# Patient Record
Sex: Female | Born: 1960 | Race: White | Hispanic: No | Marital: Married | Smoking: Current every day smoker
Health system: Southern US, Community
[De-identification: ages and names within clinical notes are randomized; demographics above are authoritative.]

## PROBLEM LIST (undated history)

## (undated) DIAGNOSIS — I712 Thoracic aortic aneurysm, without rupture, unspecified: Secondary | ICD-10-CM

## (undated) DIAGNOSIS — T4145XA Adverse effect of unspecified anesthetic, initial encounter: Secondary | ICD-10-CM

## (undated) DIAGNOSIS — T8859XA Other complications of anesthesia, initial encounter: Secondary | ICD-10-CM

## (undated) DIAGNOSIS — Z9289 Personal history of other medical treatment: Secondary | ICD-10-CM

## (undated) DIAGNOSIS — E785 Hyperlipidemia, unspecified: Secondary | ICD-10-CM

## (undated) DIAGNOSIS — I10 Essential (primary) hypertension: Secondary | ICD-10-CM

## (undated) DIAGNOSIS — Z860100 Personal history of colon polyps, unspecified: Secondary | ICD-10-CM

## (undated) DIAGNOSIS — N1330 Unspecified hydronephrosis: Secondary | ICD-10-CM

## (undated) DIAGNOSIS — R1900 Intra-abdominal and pelvic swelling, mass and lump, unspecified site: Secondary | ICD-10-CM

## (undated) DIAGNOSIS — K589 Irritable bowel syndrome without diarrhea: Secondary | ICD-10-CM

## (undated) DIAGNOSIS — K5730 Diverticulosis of large intestine without perforation or abscess without bleeding: Secondary | ICD-10-CM

## (undated) DIAGNOSIS — I714 Abdominal aortic aneurysm, without rupture, unspecified: Secondary | ICD-10-CM

## (undated) DIAGNOSIS — R51 Headache: Secondary | ICD-10-CM

## (undated) HISTORY — DX: Abdominal aortic aneurysm, without rupture: I71.4

## (undated) HISTORY — DX: Essential (primary) hypertension: I10

## (undated) HISTORY — DX: Personal history of colonic polyps: Z86.010

## (undated) HISTORY — DX: Hyperlipidemia, unspecified: E78.5

## (undated) HISTORY — PX: OTHER SURGICAL HISTORY: SHX169

## (undated) HISTORY — DX: Abdominal aortic aneurysm, without rupture, unspecified: I71.40

## (undated) HISTORY — DX: Thoracic aortic aneurysm, without rupture, unspecified: I71.20

## (undated) HISTORY — PX: LEG SURGERY: SHX1003

## (undated) HISTORY — DX: Irritable bowel syndrome, unspecified: K58.9

## (undated) HISTORY — DX: Unspecified hydronephrosis: N13.30

## (undated) HISTORY — DX: Thoracic aortic aneurysm, without rupture: I71.2

## (undated) HISTORY — DX: Personal history of colon polyps, unspecified: Z86.0100

---

## 2006-09-17 ENCOUNTER — Ambulatory Visit (HOSPITAL_COMMUNITY): Admission: RE | Admit: 2006-09-17 | Discharge: 2006-09-17 | Payer: Self-pay | Admitting: Surgery

## 2006-11-06 ENCOUNTER — Ambulatory Visit (HOSPITAL_COMMUNITY): Admission: RE | Admit: 2006-11-06 | Discharge: 2006-11-06 | Payer: Self-pay | Admitting: *Deleted

## 2007-03-03 ENCOUNTER — Emergency Department (HOSPITAL_COMMUNITY): Admission: EM | Admit: 2007-03-03 | Discharge: 2007-03-03 | Payer: Self-pay | Admitting: Emergency Medicine

## 2007-08-26 ENCOUNTER — Encounter: Admission: RE | Admit: 2007-08-26 | Discharge: 2007-08-26 | Payer: Self-pay | Admitting: Surgery

## 2007-08-26 ENCOUNTER — Ambulatory Visit: Payer: Self-pay | Admitting: Surgery

## 2007-09-11 ENCOUNTER — Ambulatory Visit: Payer: Self-pay | Admitting: *Deleted

## 2007-10-02 ENCOUNTER — Ambulatory Visit: Payer: Self-pay | Admitting: Vascular Surgery

## 2007-10-03 ENCOUNTER — Ambulatory Visit: Payer: Self-pay | Admitting: *Deleted

## 2007-10-03 ENCOUNTER — Inpatient Hospital Stay (HOSPITAL_COMMUNITY): Admission: RE | Admit: 2007-10-03 | Discharge: 2007-10-06 | Payer: Self-pay | Admitting: *Deleted

## 2007-10-30 ENCOUNTER — Encounter: Admission: RE | Admit: 2007-10-30 | Discharge: 2007-10-30 | Payer: Self-pay | Admitting: *Deleted

## 2007-10-30 ENCOUNTER — Ambulatory Visit: Payer: Self-pay | Admitting: *Deleted

## 2008-01-07 IMAGING — CR DG CHEST 2V
1 series · 1 of 1 positions shown · non-contrast
Comparison: 10/03/07.

CLINICAL DATA: Status post surgery for thoracic aneurysm.
 CHEST ? 2 VIEW:

[w chest lat]
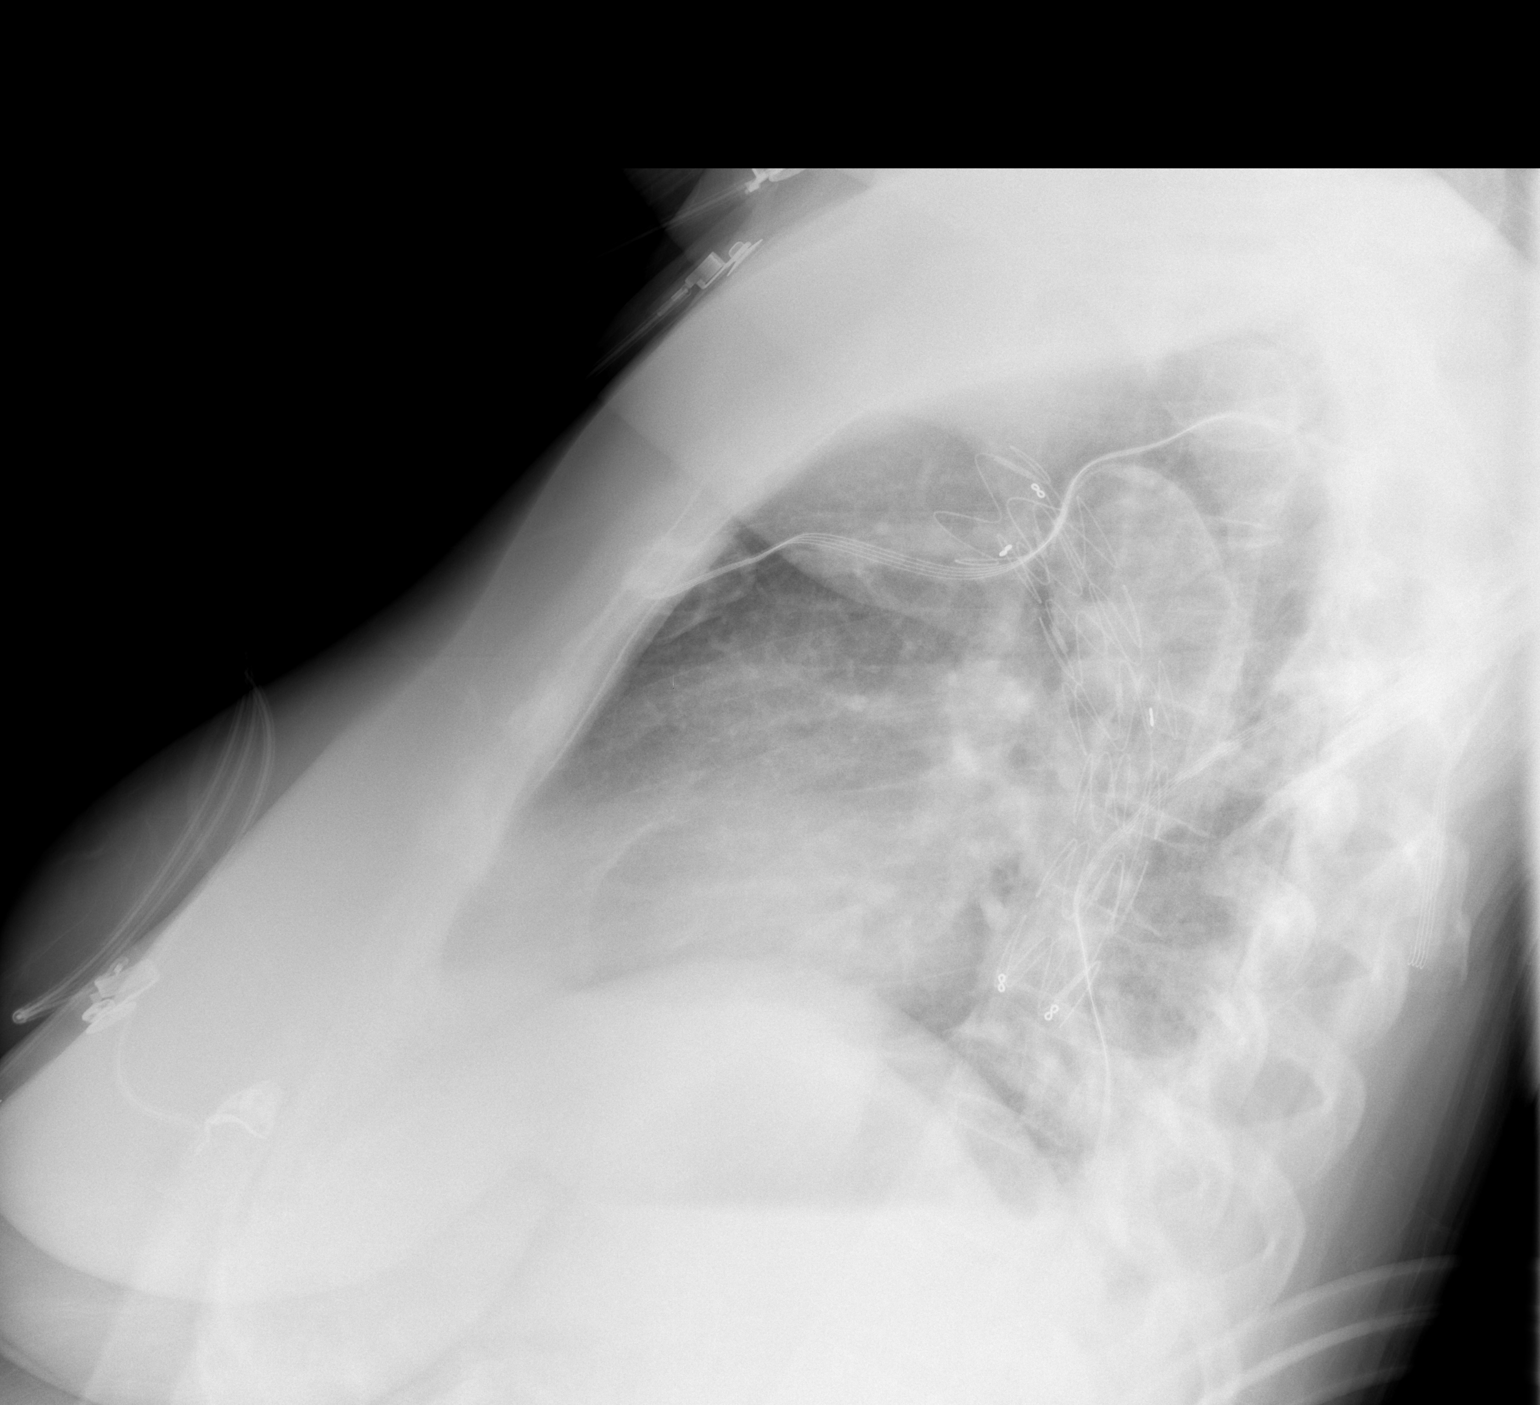

[1 of 1 positions shown; findings below may reference images not displayed]

FINDINGS: Swan-Ganz catheter has been removed.  There is new mild discoid atelectasis in the left base.  The lungs are otherwise clear.  No pneumothorax.  Heart size is normal.
IMPRESSION: Mild discoid atelectasis, left lung base.  Otherwise, negative.

## 2008-02-12 ENCOUNTER — Ambulatory Visit: Payer: Self-pay | Admitting: *Deleted

## 2008-02-12 ENCOUNTER — Encounter: Admission: RE | Admit: 2008-02-12 | Discharge: 2008-02-12 | Payer: Self-pay | Admitting: *Deleted

## 2008-02-19 ENCOUNTER — Encounter: Admission: RE | Admit: 2008-02-19 | Discharge: 2008-02-19 | Payer: Self-pay | Admitting: *Deleted

## 2008-02-19 ENCOUNTER — Ambulatory Visit: Payer: Self-pay | Admitting: *Deleted

## 2008-08-10 ENCOUNTER — Ambulatory Visit (HOSPITAL_COMMUNITY): Admission: RE | Admit: 2008-08-10 | Discharge: 2008-08-10 | Payer: Self-pay | Admitting: *Deleted

## 2008-08-12 ENCOUNTER — Ambulatory Visit (HOSPITAL_COMMUNITY): Admission: RE | Admit: 2008-08-12 | Discharge: 2008-08-12 | Payer: Self-pay | Admitting: *Deleted

## 2008-08-26 ENCOUNTER — Ambulatory Visit: Payer: Self-pay | Admitting: *Deleted

## 2009-03-03 ENCOUNTER — Encounter: Admission: RE | Admit: 2009-03-03 | Discharge: 2009-03-03 | Payer: Self-pay | Admitting: *Deleted

## 2009-03-03 ENCOUNTER — Ambulatory Visit: Payer: Self-pay | Admitting: *Deleted

## 2011-01-14 ENCOUNTER — Encounter: Payer: Self-pay | Admitting: Vascular Surgery

## 2011-01-14 ENCOUNTER — Encounter: Payer: Self-pay | Admitting: *Deleted
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
## 2011-05-08 NOTE — Assessment & Plan Note (Signed)
OFFICE VISIT   DELORSE, SHANE  DOB:  [birth year]                                       03/03/2009  CHART#:[id number]   Liberty Seto is a 50 year old female who underwent placement of  thoracic aortic stent graft 10/03/2007 at [facility].  This was  placed via a right retroperitoneal iliac conduit.  She did have some  postoperative right hydronephrosis, however this has resolved quite  nicely on her most recent ultrasound.   CT scan was performed today, this reveals no complicating features  associated with stent graft.  This is well placed without evidence of  flow into the aneurysm sac.   Ms. Rumer appears well.  BP 120/75, pulse 112 per minute.  Chest is  clear without rales or rhonchi.  Normal heart sounds without murmurs.  No carotid bruits.   Overall, Ms. Roemmich is doing well following placement of thoracic  Endograft.  We will plan follow-up in 1 year with repeat CT scan.   Balinda Quails, M.D.  Electronically Signed   PGH/MEDQ  D:  03/03/2009  T:  03/04/2009  Job:  [id number]   cc:   Brent Bulla, MD
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
## 2011-05-08 NOTE — Assessment & Plan Note (Signed)
OFFICE VISIT   Karina, Lewis  DOB:  [birth year]                                       10/30/2007  CHART#:[id number]   Patient is status post placement of a proximal descending thoracic  aortic stent graft (Talent 22 x 22 x 11.6) on October 03, 2007 at [facility].  This was an uneventful procedure.  She presents today  with a CT scan.  This reveals good position of the stent graft.  This  reveals good position of the stent graft without evidence of endoleak.   Patient did undergo a right retroperitoneal approach for placement of a  conduit into the terminal aorta.  This is healing well.  No evidence of  herniation.  Abdomen is soft and nontender.  BP 142/88, pulse 82 per  minute, respirations 16 per minute.   Patient is recovering well following her surgery.  Will plan to follow  up with her again in three months with a repeat CT scan.   Balinda Quails, M.D.  Electronically Signed   PGH/MEDQ  D:  10/30/2007  T:  10/31/2007  Job:  [id number]   cc:   Evelene Croon, M.D.  Peter M. Swaziland, M.D.  Tanvir A. Chodri, M.D.
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
## 2011-05-08 NOTE — Assessment & Plan Note (Signed)
OFFICE VISIT   RANDILYN, FOISY  DOB:  09/07/1961                                       08/26/2008  CHART#:[id number]   The patient had a thoracic aortic stent graft placed 10/03/2007 at [facility].  This was placed via conduit from the abdominal aorta via  a right retroperitoneal incision.   Postoperatively she was noted to have mild to moderate hydronephrosis.  She presents at this time with followup studies.  Blood renal functions  study shows no functional obstructive hydronephrosis of the right  kidney.  Normal differential renal scan bilaterally.  Ultrasound reveals  mild fullness in the right collecting system which has improved since  prior ultrasound.   CT scan of the chest reveals no complicating features associated with  thoracic aortic stent graft and mild contraction of the excluded  aneurysm sac.   The patient is doing well.  Her blood pressure is 120/77, pulse 84 per  minute.  Her chest is clear with equal entry bilaterally.  Abdomen is  soft and nontender.  No masses or organomegaly.  A well-healed right  retroperitoneal incision.   Overall, the patient is doing well following her surgery.  I will plan  to follow up with her again in 6 months with repeat CT scan.   Balinda Quails, M.D.  Electronically Signed   PGH/MEDQ  D:  08/26/2008  T:  08/28/2008  Job:  [id number]
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
## 2011-05-08 NOTE — Assessment & Plan Note (Signed)
OFFICE VISIT   [name], [name]  DOB:  02-Oct-1961                                       02/19/2008  CHART#:[id number]   HISTORY:  The patient returned to the office today after undergoing  abdominal ultrasound.  This does reveal mild to moderate right  hydronephrosis.  I did discuss the results of this ultrasound with Dr.  Lynelle Smoke I. Tannenbaum.  He suggested followup in 6 months with repeat  ultrasound and a Lasix renogram.  This will be arranged at the time of  her followup for monitoring of her stent graft.   The patient was informed of these results.  Will plan therefore followup  in 6 months as noted.   Balinda Quails, M.D.  Electronically Signed   PGH/MEDQ  D:  02/19/2008  T:  02/21/2008  Job:  [id number]

## 2011-05-08 NOTE — Assessment & Plan Note (Signed)
OFFICE VISIT   [name], [name]  DOB:  Nov 28, 1961                                       02/12/2008  CHART#:[id number]   Patient is status post placement of a proximal descending thoracic  aortic stent graft in October of last year.  She had a follow-up CT scan  at this time.  This reveals the stent to be in good position with  thrombosis of the proximal thoracic aneurysm.   A note is made on the CAT scan of possible hydronephrosis in the right  kidney.  She did undergo a right retroperitoneal approach for placement  of conduit into the terminal aorta.   PHYSICAL EXAMINATION:  Patient has no complaints at this time.  BP  149/81, pulse 120 per minute, respirations 18 per minute.  Incision is  well healed in the abdomen.  No tenderness.  Femoral pulses are 2+  bilaterally.   I am going to go ahead and order an ultrasound of the abdomen to further  evaluate this right kidney.  If there indeed is significant  hydronephrosis, plan referral to urologist for further evaluation.   Balinda Quails, M.D.  Electronically Signed   PGH/MEDQ  D:  02/12/2008  T:  02/13/2008  Job:  [id number]   cc:   Janine Limbo, MD
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
## 2011-10-04 LAB — URINALYSIS, ROUTINE W REFLEX MICROSCOPIC
Glucose, UA: NEGATIVE
Hgb urine dipstick: NEGATIVE
Protein, ur: NEGATIVE
Specific Gravity, Urine: 1.018
Urobilinogen, UA: 0.2

## 2011-10-04 LAB — COMPREHENSIVE METABOLIC PANEL
ALT: 22
AST: 21
CO2: 22
Calcium: 9.3
GFR calc Af Amer: >60
GFR calc non Af Amer: >60
Sodium: 139
Total Protein: 7.4

## 2011-10-04 LAB — POCT I-STAT 7, (LYTES, BLD GAS, ICA,H+H)
Acid-base deficit: 2
Bicarbonate: 25.2 — ABNORMAL HIGH
Calcium, Ion: 1.17
HCT: 35 — ABNORMAL LOW
Hemoglobin: 11.9 — ABNORMAL LOW
O2 Saturation: 100
Operator id: 249321
Patient temperature: 35.8
Sodium: 143
TCO2: 26
pCO2 arterial: 36.7
pCO2 arterial: 38.6
pH, Arterial: 7.4
pO2, Arterial: 294 — ABNORMAL HIGH
pO2, Arterial: 417 — ABNORMAL HIGH

## 2011-10-04 LAB — CBC
MCHC: 34.2
MCHC: 34.5
MCV: 94
Platelets: 151
RBC: 3.3 — ABNORMAL LOW
RBC: 5.1
WBC: 12.1 — ABNORMAL HIGH
WBC: 12.3 — ABNORMAL HIGH

## 2011-10-04 LAB — TYPE AND SCREEN
ABO/RH(D): O POS
DAT, IgG: NEGATIVE

## 2011-10-04 LAB — BASIC METABOLIC PANEL
BUN: 7
Calcium: 8.1 — ABNORMAL LOW
Calcium: 8.1 — ABNORMAL LOW
Chloride: 105
Chloride: 107
Creatinine, Ser: 0.64
Creatinine, Ser: 0.69
GFR calc Af Amer: >60

## 2011-10-04 LAB — BLOOD GAS, ARTERIAL
Drawn by: 206361
FIO2: 0.21
O2 Saturation: 96.8
pCO2 arterial: 38.3
pH, Arterial: 7.409 — ABNORMAL HIGH
pO2, Arterial: 84.6

## 2011-10-04 LAB — URINE MICROSCOPIC-ADD ON

## 2013-11-30 ENCOUNTER — Encounter: Payer: Self-pay | Admitting: Vascular Surgery

## 2013-12-01 ENCOUNTER — Ambulatory Visit: Payer: Commercial Managed Care - PPO | Admitting: Lab

## 2013-12-01 ENCOUNTER — Ambulatory Visit: Payer: Commercial Managed Care - PPO | Attending: Gynecologic Oncology | Admitting: Gynecologic Oncology

## 2013-12-01 ENCOUNTER — Encounter: Payer: Self-pay | Admitting: Gynecologic Oncology

## 2013-12-01 VITALS — BP 143/68 | HR 85 | Temp 98.6°F | Resp 16 | Ht 63.78 in | Wt 185.8 lb

## 2013-12-01 DIAGNOSIS — I714 Abdominal aortic aneurysm, without rupture, unspecified: Secondary | ICD-10-CM

## 2013-12-01 DIAGNOSIS — I10 Essential (primary) hypertension: Secondary | ICD-10-CM

## 2013-12-01 DIAGNOSIS — E785 Hyperlipidemia, unspecified: Secondary | ICD-10-CM | POA: Insufficient documentation

## 2013-12-01 DIAGNOSIS — Z79899 Other long term (current) drug therapy: Secondary | ICD-10-CM | POA: Insufficient documentation

## 2013-12-01 DIAGNOSIS — R1900 Intra-abdominal and pelvic swelling, mass and lump, unspecified site: Secondary | ICD-10-CM

## 2013-12-01 DIAGNOSIS — N9489 Other specified conditions associated with female genital organs and menstrual cycle: Secondary | ICD-10-CM | POA: Insufficient documentation

## 2013-12-01 DIAGNOSIS — R142 Eructation: Secondary | ICD-10-CM | POA: Insufficient documentation

## 2013-12-01 DIAGNOSIS — N1330 Unspecified hydronephrosis: Secondary | ICD-10-CM

## 2013-12-01 DIAGNOSIS — R141 Gas pain: Secondary | ICD-10-CM | POA: Insufficient documentation
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
## 2013-12-01 NOTE — Patient Instructions (Signed)
We'll call you with the results of your bloodwork from today. Please call Dr. Duard Brady at [phone] after your appointment with Dr. Arbie Cookey.

## 2013-12-02 LAB — CA 125: CA 125: 10 U/mL (ref 0.0–30.2)

## 2013-12-03 ENCOUNTER — Encounter: Payer: Self-pay | Admitting: Vascular Surgery

## 2013-12-04 ENCOUNTER — Telehealth: Payer: Self-pay | Admitting: Gynecologic Oncology

## 2013-12-04 NOTE — Telephone Encounter (Signed)
Patient informed of CA 125 results.  No concerns voiced.  Instructed to call for any needs or concerns. 

## 2013-12-08 ENCOUNTER — Ambulatory Visit: Payer: Self-pay | Admitting: Gynecologic Oncology

## 2013-12-14 ENCOUNTER — Encounter: Payer: Self-pay | Admitting: Vascular Surgery

## 2013-12-15 ENCOUNTER — Encounter: Payer: Self-pay | Admitting: Vascular Surgery

## 2013-12-15 ENCOUNTER — Ambulatory Visit (INDEPENDENT_AMBULATORY_CARE_PROVIDER_SITE_OTHER): Payer: Commercial Managed Care - PPO | Admitting: Vascular Surgery

## 2013-12-15 ENCOUNTER — Encounter: Payer: Self-pay | Admitting: *Deleted

## 2013-12-15 VITALS — BP 123/63 | HR 75 | Ht 63.0 in | Wt 188.2 lb

## 2013-12-15 DIAGNOSIS — I729 Aneurysm of unspecified site: Secondary | ICD-10-CM | POA: Insufficient documentation
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
## 2013-12-16 ENCOUNTER — Encounter: Payer: Self-pay | Admitting: Urology

## 2014-01-06 ENCOUNTER — Other Ambulatory Visit (HOSPITAL_COMMUNITY): Payer: Self-pay | Admitting: Urology

## 2014-01-06 DIAGNOSIS — N1330 Unspecified hydronephrosis: Secondary | ICD-10-CM

## 2014-01-08 ENCOUNTER — Other Ambulatory Visit (HOSPITAL_COMMUNITY): Payer: Self-pay | Admitting: Urology

## 2014-01-08 DIAGNOSIS — N1330 Unspecified hydronephrosis: Secondary | ICD-10-CM

## 2014-01-11 ENCOUNTER — Other Ambulatory Visit: Payer: Self-pay | Admitting: Urology

## 2014-01-13 ENCOUNTER — Encounter (HOSPITAL_COMMUNITY): Payer: Self-pay | Admitting: Pharmacy Technician

## 2014-01-14 ENCOUNTER — Ambulatory Visit (HOSPITAL_COMMUNITY)
Admission: RE | Admit: 2014-01-14 | Discharge: 2014-01-14 | Disposition: A | Discharge status: Home / Self Care | Payer: Commercial Managed Care - PPO | Source: Ambulatory Visit | Attending: Urology | Admitting: Urology

## 2014-01-14 DIAGNOSIS — N1330 Unspecified hydronephrosis: Secondary | ICD-10-CM | POA: Insufficient documentation

## 2014-01-14 MED ORDER — FUROSEMIDE 10 MG/ML IJ SOLN
INTRAMUSCULAR | Status: AC
  Administered 2014-01-14: 42 mg via INTRAVENOUS
  Filled 2014-01-14: qty 2

## 2014-01-14 MED ORDER — FUROSEMIDE 10 MG/ML IJ SOLN
42.0000 mg | Freq: Once | INTRAMUSCULAR | Status: AC
  Administered 2014-01-14: 42 mg via INTRAVENOUS

## 2014-01-14 MED ORDER — TECHNETIUM TC 99M MERTIATIDE
15.0000 | Freq: Once | INTRAVENOUS | Status: AC | PRN
  Administered 2014-01-14: 15 via INTRAVENOUS

## 2014-01-14 MED ORDER — FUROSEMIDE 10 MG/ML IJ SOLN
INTRAMUSCULAR | Status: AC
  Filled 2014-01-14: qty 4

## 2014-01-14 NOTE — Progress Notes (Signed)
Patient has intolerance to Sulfa drugs, was listed as nausea vomiting. Patient not sure re reaction as was a long time ago. Has had Lasix before with no problems. Talked with Pilar Plate in main pharmacy and he said there should be no problem with the Lasix.

## 2014-01-15 ENCOUNTER — Encounter (HOSPITAL_COMMUNITY)
Admission: RE | Admit: 2014-01-15 | Discharge: 2014-01-15 | Disposition: A | Discharge status: Home / Self Care | Payer: Commercial Managed Care - PPO | Source: Ambulatory Visit | Attending: Urology | Admitting: Urology

## 2014-01-15 ENCOUNTER — Ambulatory Visit (HOSPITAL_COMMUNITY)
Admission: RE | Admit: 2014-01-15 | Discharge: 2014-01-15 | Disposition: A | Discharge status: Home / Self Care | Payer: Commercial Managed Care - PPO | Source: Ambulatory Visit | Attending: Urology | Admitting: Urology

## 2014-01-15 ENCOUNTER — Encounter (HOSPITAL_COMMUNITY): Payer: Self-pay

## 2014-01-15 DIAGNOSIS — N1330 Unspecified hydronephrosis: Secondary | ICD-10-CM | POA: Insufficient documentation

## 2014-01-15 DIAGNOSIS — Z01810 Encounter for preprocedural cardiovascular examination: Secondary | ICD-10-CM | POA: Insufficient documentation

## 2014-01-15 DIAGNOSIS — Z01812 Encounter for preprocedural laboratory examination: Secondary | ICD-10-CM | POA: Insufficient documentation

## 2014-01-15 DIAGNOSIS — I719 Aortic aneurysm of unspecified site, without rupture: Secondary | ICD-10-CM | POA: Insufficient documentation

## 2014-01-15 DIAGNOSIS — Z01818 Encounter for other preprocedural examination: Secondary | ICD-10-CM | POA: Insufficient documentation

## 2014-01-15 HISTORY — DX: Adverse effect of unspecified anesthetic, initial encounter: T41.45XA

## 2014-01-15 HISTORY — DX: Intra-abdominal and pelvic swelling, mass and lump, unspecified site: R19.00

## 2014-01-15 HISTORY — DX: Diverticulosis of large intestine without perforation or abscess without bleeding: K57.30

## 2014-01-15 HISTORY — DX: Headache: R51

## 2014-01-15 HISTORY — DX: Other complications of anesthesia, initial encounter: T88.59XA

## 2014-01-15 HISTORY — DX: Unspecified hydronephrosis: N13.30

## 2014-01-15 LAB — BASIC METABOLIC PANEL
BUN: 26 mg/dL — AB (ref 6–23)
CHLORIDE: 102 meq/L (ref 96–112)
CO2: 28 mEq/L (ref 19–32)
Calcium: 9.6 mg/dL (ref 8.4–10.5)
Creatinine, Ser: 1 mg/dL (ref 0.50–1.10)
GFR calc Af Amer: 73 mL/min — ABNORMAL LOW (ref >90)
GFR calc non Af Amer: 63 mL/min — ABNORMAL LOW (ref >90)
Glucose, Bld: 95 mg/dL (ref 70–99)
POTASSIUM: 3.9 meq/L (ref 3.7–5.3)
Sodium: 144 mEq/L (ref 137–147)

## 2014-01-15 LAB — CBC
HCT: 48.8 % — ABNORMAL HIGH (ref 36.0–46.0)
Hemoglobin: 16.4 g/dL — ABNORMAL HIGH (ref 12.0–15.0)
MCH: 30.7 pg (ref 26.0–34.0)
MCHC: 33.6 g/dL (ref 30.0–36.0)
MCV: 91.4 fL (ref 78.0–100.0)
PLATELETS: 213 10*3/uL (ref 150–400)
RBC: 5.34 MIL/uL — AB (ref 3.87–5.11)
RDW: 13.6 % (ref 11.5–15.5)
WBC: 9.2 10*3/uL (ref 4.0–10.5)

## 2014-01-15 NOTE — Pre-Procedure Instructions (Addendum)
EKG AND CXR WERE DONE TODAY - PREOP - AT [facility]. OFFICE NOTES FROM PT'S MEDICAL DOCTOR'S OFFFICE - FIVE POINTS MEDICAL ON CHART - OV WAS 11/03/13. PT STATES SHE HAD ECHO WITH DR. Basilio Cairo  10/09/13 - [facility] IN [place] - PHONE  [phone] --OFFICE CLOSED THIS AFTERNOON - I FAXED A REQUEST FOR THE REPORT AND ANY OFFICE NOTES AND / OR EKG REPORT TO BE FAXED Monday AM TO SHORT STAY CENTER. PT'S BMET REPORT FAXED TO DR. Lynne Logan OFFICE FOR REVIEW OF BUN 26.

## 2014-01-15 NOTE — Patient Instructions (Addendum)
YOUR SURGERY IS SCHEDULED AT [facility]  ON:  Monday  1/26  REPORT TO  SHORT STAY CENTER AT:  12:45 PM      PHONE # FOR SHORT STAY IS [phone]  DO NOT EAT  ANYTHING AFTER MIDNIGHT THE NIGHT BEFORE YOUR SURGERY.  YOU MAY BRUSH YOUR TEETH, RINSE OUT YOUR MOUTH--BUTNO FOOD, NO CHEWING GUM, NO MINTS, NO CANDIES, NO CHEWING TOBACCO. YOU MAY HAVE CLEAR LIQUIDS TO DRINK FROM MIDNIGHT UNTIL 8:30 AM DAY OF SURGERY - LIKE WATER, COFFEE - NO MILK OR MILK PRODUCTS.   NOTHING TO DRINK AFTER 8:30 AM DAY OF SURGERY.  PLEASE TAKE THE FOLLOWING MEDICATIONS THE AM OF YOUR SURGERY WITH A FEW SIPS OF WATER:  METOPROLOL   DO NOT BRING VALUABLES, MONEY, CREDIT CARDS.  DO NOT WEAR JEWELRY, MAKE-UP, NAIL POLISH AND NO METAL PINS OR CLIPS IN YOUR HAIR. CONTACT LENS, DENTURES / PARTIALS, GLASSES SHOULD NOT BE WORN TO SURGERY AND IN MOST CASES-HEARING AIDS WILL NEED TO BE REMOVED.  BRING YOUR GLASSES CASE, ANY EQUIPMENT NEEDED FOR YOUR CONTACT LENS. FOR PATIENTS ADMITTED TO THE HOSPITAL--CHECK OUT TIME THE DAY OF DISCHARGE IS 11:00 AM.  ALL INPATIENT ROOMS ARE PRIVATE - WITH BATHROOM, TELEPHONE, TELEVISION AND WIFI INTERNET.  IF YOU ARE BEING DISCHARGED THE SAME DAY OF YOUR SURGERY--YOU CAN NOT DRIVE YOURSELF HOME--AND SHOULD NOT GO HOME ALONE BY TAXI OR BUS.  NO DRIVING OR OPERATING MACHINERY, DO NOT MAKE LEGAL DECISIONS FOR 24 HOURS FOLLOWING ANESTHESIA / PAIN MEDICATIONS.  PLEASE MAKE ARRANGEMENTS FOR SOMEONE TO BE WITH YOU AT HOME THE FIRST 24 HOURS AFTER SURGERY. RESPONSIBLE DRIVER'S NAME / PHONE                                     [place] IN THE CANCELLATION OF YOUR SURGERY. PLEASE BE AWARE THAT YOU MAY NEED ADDITIONAL BLOOD DRAWN DAY OF YOUR SURGERY  PATIENT SIGNATURE_________________________________

## 2014-01-15 NOTE — H&P (Signed)
Chief Complaint Right hydronephrosis   History of Present Illness [name] is a pleasant 53 year old female seen today in consultation at the request of Dr. Sherren Mocha Early. Overall, she has been relatively healthy although was incidentally found to have a thoracic aortic aneurysm in 2008 and underwent a stent graft repair of her descending thoracic aneurysm on 10/03/07. She apparently had transient right-sided hydronephrosis in 2009 although this had apparently resolved on ultrasound during a visit with her vascular surgeon, Dr. Amedeo Plenty, in March 2010. She has denied any right-sided flank pain or hematuria. She denies a history of renal dysfunction. She has no history of urolithiasis, congenital UPJ obstruction, or history of GU malignancy/trauma/surgery.    At the end of October, she followed up with her cardiologist for a routine surveillance visit. As part of her evaluation, she did undergo a CT scan of the chest for follow-up of her thoracic aortic aneurysm. Incidentally, this detected significant right-sided hydronephrosis on the CT cuts of her upper right kidney. She was then referred to Dr. Nila Nephew for further urologic evaluation. A CT scan of the abdomen and pelvis was performed in November 2014 which confirmed severe incidentally detected right-sided hydronephrosis with evidence of a proximal dilated ureter down to the proximal/mid ureter where a distinct transition point was noted. This was located at a site possibly felt to be related to a saccular aortic aneurysm. In addition, she was noted to have thinning of her renal cortex on the right although did have some demonstrable preserved renal parenchyma. On further review of her records, Dr. Donnetta Hutching noted that she had a Hemashield graft conduit sewn onto the right lateral aspect of the aorta just above the aortic bifurcation. This explained the CT findings and confirmed that this was not a saccular aortic aneurysm but rather radiologic findings  consistent with her graft conduit. 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 Nancy Marus. This cystic mass was present on her imaging study apparently in 2008 and had significantly increased in size. Dr. Alycia Rossetti has recommended proceeding with surgical removal of her ovarian mass.   Past Medical History Problems  1. History of abdominal aortic aneurysm (V12.59) 2. History of colonic polyps (V12.72) 3. History of hyperlipidemia (V12.29) 4. History of hypertension (V12.59) 5. History of irritable bowel syndrome (V12.79) 6. History of Thoracic aortic aneurysm (441.2)  Surgical History Problems  1. History of Femur Repair 2. History of Leg Repair 3. History of Surgery For Abdominal Aortic Aneurysm  Current Meds 1. Cinnamon CAPS;  Therapy: (Recorded:13Jan2015) to Recorded 2. CoQ10 100 MG Oral Capsule;  Therapy: (Recorded:13Jan2015) to Recorded 3. Magnesium CAPS;  Therapy: (Recorded:13Jan2015) to Recorded 4. Multi-Vitamin TABS;  Therapy: (Recorded:13Jan2015) to Recorded 5. Naproxen Sodium 550 MG Oral Tablet;  Therapy: (Recorded:13Jan2015) to Recorded 6. Pravastatin Sodium 40 MG Oral Tablet;  Therapy: (Recorded:13Jan2015) to Recorded 7. Sominex TABS;  Therapy: (Recorded:13Jan2015) to Recorded 8. Toprol XL 25 MG Oral Tablet Extended Release 24 Hour (Metoprolol Succinate ER);  Therapy: (Recorded:13Jan2015) to Recorded 9. Vitamin C 500 MG Oral Capsule;  Therapy: (Recorded:13Jan2015) to Recorded  Allergies Medication  1. Percodan TABS 2. Sulfa Drugs Non-Medication  3. Adhesive Tape  Family History Problems  1. Family history of cardiac disorder (V17.49) : Mother 2. Family history of diabetes mellitus (V18.0) : Mother 3. Family history of hyperlipidemia (V18.19) : Mother, Father 4. Family history of hypertension (V17.49) : Father  Social History Problems    Alcohol use  1 glass of wine per week    Current every day smoker (305.1)   Married  Review of Systems Genitourinary, constitutional, skin, eye, otolaryngeal, hematologic/lymphatic, cardiovascular, pulmonary, endocrine, musculoskeletal, gastrointestinal, neurological and psychiatric system(s) were reviewed and pertinent findings if present are noted.  Constitutional: night sweats and feeling tired (fatigue).  ENT: sinus problems.  Musculoskeletal: back pain.  Neurological: headache.    Vitals Vital Signs [Data Includes: Last 1 Day]  Recorded: [id number] 11:22AM  Height: 5 ft 3.5 in Weight: 187 lb  BMI Calculated: 32.61 BSA Calculated: 1.89 Blood Pressure: 143 / 80 Heart Rate: 81  Physical Exam Constitutional: Well nourished and well developed . No acute distress.  ENT:. The ears and nose are normal in appearance.  Neck: The appearance of the neck is normal and no neck mass is present.  Pulmonary: No respiratory distress, normal respiratory rhythm and effort and clear bilateral breath sounds.  Cardiovascular: Heart rate and rhythm are normal . No peripheral edema.  Abdomen: right lower quadrant incision site(s) well healed. The abdomen is soft and nontender. No masses are palpated. No CVA tenderness. No hernias are palpable. No hepatosplenomegaly noted.  Lymphatics: The femoral and inguinal nodes are not enlarged or tender.  Skin: Normal skin turgor, no visible rash and no visible skin lesions.  Neuro/Psych:. Mood and affect are appropriate.    Results/Data Urine [Data Includes: Last 1 Day]   [id number]  COLOR STRAW   APPEARANCE CLEAR   SPECIFIC GRAVITY <1.005   pH 7.0   GLUCOSE NEG mg/dL  BILIRUBIN NEG   KETONE NEG mg/dL  BLOOD NEG   PROTEIN NEG mg/dL  UROBILINOGEN 0.2 mg/dL  NITRITE NEG   LEUKOCYTE ESTERASE NEG    I have extensively reviewed her medical records and imaging findings including her recent CT scan of the chest, CT scan of the abdomen and pelvis, and renal ultrasound.   Assessment Assessed  1.  Hydronephrosis (591)  Plan Health Maintenance  1. UA With REFLEX; [Do Not Release]; Status:Complete;   Done: 20URK2706 10:27AM Hydronephrosis  2. BASIC METABOLIC PANEL; Status:In Progress - Specimen/Data Collected;   Done:  [id number] 3. LASIX RENOGRAM; Status:Hold For - Appointment,PreCert,Print,Records; Requested  VVO:[id number];  4. VENIPUNCTURE; Status:Complete;   Done: [id number] 5. Follow-up Office  Follow-up - Will call to schedule surgery  Status: Hold For -  Appointment,Date of Service  Requested for: [id number]  Discussion/Summary 1. Right hydronephrosis: Considering her imaging studies, she appears to have at least partial obstruction of the right ureter at the level of her graft conduit off her aorta near the bifurcation of the aorta. She does appear to have some thinning of the right renal cortex suggesting that this process may be long-standing and consistent with chronic obstruction. Fortunately, she has been asymptomatic. I have recommended further evaluation including a Lasix renal scan to further evaluate her relative renal function and the degree of obstruction that may be present. I then recommended that we proceed with endoscopic evaluation including cystoscopy, right retrograde pyelography, and right ureteral stent placement. She understands that she may require ureteroscopy. We have reviewed the potential risks, complications, and expected recovery process associated with the endoscopic outpatient procedure.    Assuming that she does have preserved relative renal function of the right kidney and assuming that she does not have any other potential etiology for her obstruction, she likely will require a more definitive procedure including exploratory laparotomy, right ureterolysis versus a reconstructive procedure of the right ureter which could involve ureteroureterostomy, psoas hitch/Boari flap,  or ileal ureter as possibilities. We also discussed potentially performing this  simultaneously with her right oophorectomy. Finally, we discussed the possibility of performing this in a minimally invasive fashion. She may be a candidate for a robotic-assisted laparoscopic approach for both her ureteral procedure and her ovarian procedure. However, she understands that she may have abdominal adhesions that may preclude this approach and may require an open midline incision.    Cc: Dr. Sherren Mocha Early  Dr. Nancy Marus  Dr. Lucita Lora     Verified Results BASIC METABOLIC PANEL1 [id number] 12:27PM1 Read Drivers  SPECIMEN TYPE: BLOOD  [Jan 05, 2014 6:19PM Eladio Dentremont] Notify patient that all labs are normal.   Test Name Result Flag Reference  GLUCOSE1 87 mg/dL1  70-991  BUN1 17 mg/dL1  6-231  CREATININE1 0.96 mg/dL1  0.50-1.[name] mEq/L1  [phone]  POTASSIUM1 4.3 mEq/L1  3.5-5.31  CHLORIDE1 104 mEq/L1  96-1121  CO21 29 mEq/L1  19-321  CALCIUM1 9.7 mg/dL1  8.4-10.51  Est GFR, African American1 78 mL/min1    Est GFR, NonAfrican American1 68 mL/min1    THE ESTIMATED GFR IS A CALCULATION VALID FOR ADULTS (>=61 YEARS OLD) THAT USES THE CKD-EPI ALGORITHM TO ADJUST FOR AGE AND SEX. IT IS   NOT TO BE USED FOR CHILDREN, PREGNANT WOMEN, HOSPITALIZED PATIENTS,    PATIENTS ON DIALYSIS, OR WITH RAPIDLY CHANGING KIDNEY FUNCTION. ACCORDING TO THE NKDEP, EGFR >89 IS NORMAL, 60-89 SHOWS MILD IMPAIRMENT, 30-59 SHOWS MODERATE IMPAIRMENT, 15-29 SHOWS SEVERE IMPAIRMENT AND <15 IS ESRD.     1. Amended By: Raynelle Bring; Jan 05 2014 6:19 PM EST  Signatures Electronically signed by : Raynelle Bring, M.D.; Jan 05 2014  6:19PM EST

## 2014-01-18 ENCOUNTER — Ambulatory Visit (HOSPITAL_COMMUNITY): Payer: Commercial Managed Care - PPO

## 2014-01-18 ENCOUNTER — Encounter (HOSPITAL_COMMUNITY)
Admission: RE | Disposition: A | Discharge status: Home / Self Care | Payer: Self-pay | Source: Ambulatory Visit | Attending: Urology

## 2014-01-18 ENCOUNTER — Encounter (HOSPITAL_COMMUNITY): Payer: Commercial Managed Care - PPO | Admitting: Anesthesiology

## 2014-01-18 ENCOUNTER — Ambulatory Visit (HOSPITAL_COMMUNITY): Payer: Commercial Managed Care - PPO | Admitting: Anesthesiology

## 2014-01-18 ENCOUNTER — Ambulatory Visit (HOSPITAL_COMMUNITY)
Admission: RE | Admit: 2014-01-18 | Discharge: 2014-01-18 | Disposition: A | Discharge status: Home / Self Care | Payer: Commercial Managed Care - PPO | Source: Ambulatory Visit | Attending: Urology | Admitting: Urology

## 2014-01-18 ENCOUNTER — Encounter (HOSPITAL_COMMUNITY): Payer: Self-pay | Admitting: *Deleted

## 2014-01-18 DIAGNOSIS — N135 Crossing vessel and stricture of ureter without hydronephrosis: Secondary | ICD-10-CM | POA: Insufficient documentation

## 2014-01-18 DIAGNOSIS — E785 Hyperlipidemia, unspecified: Secondary | ICD-10-CM | POA: Insufficient documentation

## 2014-01-18 DIAGNOSIS — K589 Irritable bowel syndrome without diarrhea: Secondary | ICD-10-CM | POA: Insufficient documentation

## 2014-01-18 DIAGNOSIS — I10 Essential (primary) hypertension: Secondary | ICD-10-CM | POA: Insufficient documentation

## 2014-01-18 DIAGNOSIS — N83209 Unspecified ovarian cyst, unspecified side: Secondary | ICD-10-CM | POA: Insufficient documentation

## 2014-01-18 DIAGNOSIS — F17200 Nicotine dependence, unspecified, uncomplicated: Secondary | ICD-10-CM | POA: Insufficient documentation

## 2014-01-18 DIAGNOSIS — Z79899 Other long term (current) drug therapy: Secondary | ICD-10-CM | POA: Insufficient documentation

## 2014-01-18 DIAGNOSIS — Q628 Other congenital malformations of ureter: Secondary | ICD-10-CM | POA: Insufficient documentation

## 2014-01-18 DIAGNOSIS — N1330 Unspecified hydronephrosis: Secondary | ICD-10-CM | POA: Insufficient documentation

## 2014-01-18 HISTORY — PX: CYSTOSCOPY WITH RETROGRADE PYELOGRAM, URETEROSCOPY AND STENT PLACEMENT: SHX5789

## 2014-01-18 SURGERY — CYSTOURETEROSCOPY, WITH RETROGRADE PYELOGRAM AND STENT INSERTION
Anesthesia: General | Site: Urethra | Laterality: Right

## 2014-01-18 MED ORDER — MIDAZOLAM HCL 2 MG/2ML IJ SOLN
INTRAMUSCULAR | Status: AC
  Filled 2014-01-18: qty 2

## 2014-01-18 MED ORDER — ONDANSETRON HCL 4 MG/2ML IJ SOLN
INTRAMUSCULAR | Status: AC
  Filled 2014-01-18: qty 2

## 2014-01-18 MED ORDER — FENTANYL CITRATE 0.05 MG/ML IJ SOLN
INTRAMUSCULAR | Status: DC | PRN
  Administered 2014-01-18: 50 ug via INTRAVENOUS
  Administered 2014-01-18 (×2): 25 ug via INTRAVENOUS

## 2014-01-18 MED ORDER — HYDROCODONE-ACETAMINOPHEN 5-325 MG PO TABS: 1.0000 | ORAL_TABLET | Freq: Four times a day (QID) | ORAL | Status: DC | PRN

## 2014-01-18 MED ORDER — PROMETHAZINE HCL 25 MG/ML IJ SOLN
6.2500 mg | INTRAMUSCULAR | Status: DC | PRN
Start: 2014-01-18 — End: 2014-01-18

## 2014-01-18 MED ORDER — CIPROFLOXACIN IN D5W 400 MG/200ML IV SOLN
400.0000 mg | INTRAVENOUS | Status: AC
  Administered 2014-01-18: 400 mg via INTRAVENOUS

## 2014-01-18 MED ORDER — CIPROFLOXACIN IN D5W 400 MG/200ML IV SOLN
INTRAVENOUS | Status: AC
  Filled 2014-01-18: qty 200

## 2014-01-18 MED ORDER — LIDOCAINE HCL (CARDIAC) 20 MG/ML IV SOLN
INTRAVENOUS | Status: AC
  Filled 2014-01-18: qty 5

## 2014-01-18 MED ORDER — HYDROMORPHONE HCL PF 1 MG/ML IJ SOLN: 0.2500 mg | INTRAMUSCULAR | Status: DC | PRN

## 2014-01-18 MED ORDER — MIDAZOLAM HCL 5 MG/5ML IJ SOLN
INTRAMUSCULAR | Status: DC | PRN
  Administered 2014-01-18: 2 mg via INTRAVENOUS

## 2014-01-18 MED ORDER — LIDOCAINE HCL (CARDIAC) 20 MG/ML IV SOLN
INTRAVENOUS | Status: DC | PRN
Start: 2014-01-18 — End: 2014-01-18
  Administered 2014-01-18: 50 mg via INTRAVENOUS

## 2014-01-18 MED ORDER — LACTATED RINGERS IV SOLN
INTRAVENOUS | Status: DC
  Administered 2014-01-18: 1000 mL via INTRAVENOUS

## 2014-01-18 MED ORDER — PROPOFOL 10 MG/ML IV BOLUS
INTRAVENOUS | Status: DC | PRN
  Administered 2014-01-18: 160 mg via INTRAVENOUS

## 2014-01-18 MED ORDER — IOHEXOL 300 MG/ML  SOLN
INTRAMUSCULAR | Status: DC | PRN
  Administered 2014-01-18: 10 mL via URETHRAL

## 2014-01-18 MED ORDER — FENTANYL CITRATE 0.05 MG/ML IJ SOLN
INTRAMUSCULAR | Status: AC
  Filled 2014-01-18: qty 2

## 2014-01-18 MED ORDER — 0.9 % SODIUM CHLORIDE (POUR BTL) OPTIME
TOPICAL | Status: DC | PRN
  Administered 2014-01-18: 1000 mL

## 2014-01-18 MED ORDER — LIDOCAINE HCL 2 % EX GEL
CUTANEOUS | Status: AC
  Filled 2014-01-18: qty 10

## 2014-01-18 MED ORDER — PROPOFOL 10 MG/ML IV BOLUS
INTRAVENOUS | Status: AC
  Filled 2014-01-18: qty 20

## 2014-01-18 MED ORDER — ONDANSETRON HCL 4 MG/2ML IJ SOLN
INTRAMUSCULAR | Status: DC | PRN
  Administered 2014-01-18: 4 mg via INTRAVENOUS

## 2014-01-18 MED ORDER — DEXAMETHASONE SODIUM PHOSPHATE 10 MG/ML IJ SOLN
INTRAMUSCULAR | Status: AC
  Filled 2014-01-18: qty 1

## 2014-01-18 MED ORDER — DEXAMETHASONE SODIUM PHOSPHATE 10 MG/ML IJ SOLN
INTRAMUSCULAR | Status: DC | PRN
  Administered 2014-01-18: 10 mg via INTRAVENOUS

## 2014-01-18 SURGICAL SUPPLY — 14 items
BAG URO CATCHER STRL LF (DRAPE) ×3 IMPLANT
BASKET ZERO TIP NITINOL 2.4FR (BASKET) IMPLANT
BSKT STON RTRVL ZERO TP 2.4FR (BASKET)
CATH INTERMIT  6FR 70CM (CATHETERS) ×2 IMPLANT
CLOTH BEACON ORANGE TIMEOUT ST (SAFETY) ×3 IMPLANT
DRAPE CAMERA CLOSED 9X96 (DRAPES) ×3 IMPLANT
GLOVE BIOGEL M STRL SZ7.5 (GLOVE) ×3 IMPLANT
GOWN STRL REUS W/TWL LRG LVL3 (GOWN DISPOSABLE) ×2 IMPLANT
GUIDEWIRE ANG ZIPWIRE 038X150 (WIRE) IMPLANT
GUIDEWIRE STR DUAL SENSOR (WIRE) ×3 IMPLANT
MANIFOLD NEPTUNE II (INSTRUMENTS) ×3 IMPLANT
PACK CYSTO (CUSTOM PROCEDURE TRAY) ×3 IMPLANT
TUBING CONNECTING 10 (TUBING) ×2 IMPLANT
TUBING CONNECTING 10' (TUBING) ×1

## 2014-01-18 NOTE — Transfer of Care (Signed)
Immediate Anesthesia Transfer of Care Note  Patient: [name]  Procedure(s) Performed: Procedure(s): CYSTOSCOPY WITH RETROGRADE PYELOGRAM (Right)  Patient Location: PACU  Anesthesia Type:General  Level of Consciousness: awake, alert  and oriented  Airway & Oxygen Therapy: Patient Spontanous Breathing and Patient connected to face mask oxygen  Post-op Assessment: Report given to PACU RN and Post -op Vital signs reviewed and stable  Post vital signs: Reviewed and stable  Complications: No apparent anesthesia complications

## 2014-01-18 NOTE — Interval H&P Note (Signed)
History and Physical Interval Note:  01/18/2014 3:04 PM  [name]  has presented today for surgery, with the diagnosis of Right Hydronephrosis  The various methods of treatment have been discussed with the patient and family. After consideration of risks, benefits and other options for treatment, the patient has consented to  Procedure(s): CYSTOSCOPY WITH RETROGRADE PYELOGRAM, URETEROSCOPY AND STENT PLACEMENT (Right) as a surgical intervention .  The patient's history has been reviewed, patient examined, no change in status, stable for surgery.  I have reviewed the patient's chart and labs.  Questions were answered to the patient's satisfaction.  I reviewed her renogram results with her which indicate a poorly functional right kidney (2% relative renal function). She understands that I will not plan to place a stent if ureteroscopy is not necessary as part of her evaluation today.   [name],[name]

## 2014-01-18 NOTE — Anesthesia Postprocedure Evaluation (Signed)
  Anesthesia Post-op Note  Patient: [name]  Procedure(s) Performed: Procedure(s) (LRB): CYSTOSCOPY WITH RETROGRADE PYELOGRAM (Right)  Patient Location: PACU  Anesthesia Type: General  Level of Consciousness: awake and alert   Airway and Oxygen Therapy: Patient Spontanous Breathing  Post-op Pain: mild  Post-op Assessment: Post-op Vital signs reviewed, Patient's Cardiovascular Status Stable, Respiratory Function Stable, Patent Airway and No signs of Nausea or vomiting  Last Vitals:  Filed Vitals:   01/18/14 1555  BP: 131/59  Pulse: 77  Temp: 36.8 C  Resp: 15    Post-op Vital Signs: stable   Complications: No apparent anesthesia complications

## 2014-01-18 NOTE — Op Note (Signed)
Preoperative diagnosis:  1. Right hydronephrosis  Postoperative diagnosis: 1. Right hydronephrosis  Procedure(s): 1. Cystoscopy 2.  Right retrograde pyelography with interpretation  Surgeon: Dr. Roxy Horseman, Jr  Anesthesia: General  Complications: None  EBL: None  Specimens: None  Intraoperative findings: Right retrograde pyelography demonstrated normal distal ureter without filling defects or dilation.  Within the ureter, medial deviation of the ureter was identified along with a distinct transition point with significant tortuosity and severe dilation of the proximal ureter.  There were no intraluminal filling defects to suggest an intrinsic process.  Findings were consistent with extrinsic obstruction related to her prior aortic graft repairs previously noted on CT imaging.  Indication: [name] is a 53 year old female who previously underwent an aortic graft repair.  She recently was incidentally found to have asymptomatic right-sided hydronephrosis with obstruction likely noted in the mid ureter.  She underwent nuclear medicine renography which demonstrated a nonfunctional right kidney.  After further discussion, she presents today to undergo cystoscopy and retrograde pyelography to exclude the possibility of intrinsic obstruction.  The potential risks, complications, alternative options, and expected recovery process had been discussed in detail and informed consent obtained.  Description of procedure:  The patient was taken to the operating room and a general anesthetic was administered.  She was given preoperative antibiotics, placed in the dorsal lithotomy position, and prepped and draped in the usual sterile fashion.  Next, a preoperative timeout was performed.  Cystourethroscopy was performed with a 22 French cystoscope.  Full inspection of the bladder revealed no evidence of bladder tumors, stones, or other mucosal pathology.  The ureteral orifices were identified in  their expected anatomic location.  Attention then turned to the right ureteral orifice.  It was cannulated with a 6 French ureteral catheter. Omnipaque contrast was then injected and findings were as dictated above.  In summary, findings were consistent with extrinsic mid ureteral obstruction with medial deviation of the ureter consistent with the patient's prior aortic graft repair.  No findings were noted to suggest intrinsic obstruction.  The patient's bladder was emptied and the procedure was ended.  She tolerated the procedure well without complications.  She was able to be awakened and transferred to the recovery unit in satisfactory condition.

## 2014-01-18 NOTE — Anesthesia Preprocedure Evaluation (Addendum)
Anesthesia Evaluation  Patient identified by MRN, date of birth, ID band Patient awake    Reviewed: Allergy & Precautions, H&P , NPO status , Patient's Chart, lab work & pertinent test results  Airway Mallampati: II TM Distance: >3 FB Neck ROM: Full    Dental no notable dental hx.    Pulmonary Current Smoker,  breath sounds clear to auscultation  Pulmonary exam normal       Cardiovascular hypertension, Pt. on medications and Pt. 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 THE DISTAL ABDOMINAL AORTA EXTENDING TO THE RIGHT AND OBSTRUCTING MID RIGHT URETER CREATING SEVER RIGHT HYDRONEPHROSIS" - PER CT  ABDOMEN AND PELVIS REPORT FROM 11/13/13 - REPORT FROM [facility] SCANNED INTO     Neuro/Psych negative neurological ROS  negative psych ROS   GI/Hepatic negative GI ROS, Neg liver ROS,   Endo/Other  negative endocrine ROS  Renal/GU negative Renal ROS  negative genitourinary   Musculoskeletal negative musculoskeletal ROS (+)   Abdominal   Peds negative pediatric ROS (+)  Hematology negative hematology ROS (+)   Anesthesia Other Findings   Reproductive/Obstetrics negative OB ROS                           Anesthesia Physical Anesthesia Plan  ASA: III  Anesthesia Plan: General   Post-op Pain Management:    Induction: Intravenous  Airway Management Planned: LMA  Additional Equipment:   Intra-op Plan:   Post-operative Plan:   Informed Consent: I have reviewed the patients History and Physical, chart, labs and discussed the procedure including the risks, benefits and alternatives for the proposed anesthesia with the patient or authorized representative who has indicated his/her understanding and acceptance.   Dental advisory given  Plan Discussed with: CRNA and Surgeon  Anesthesia Plan  Comments:        Anesthesia Quick Evaluation

## 2014-01-18 NOTE — Discharge Instructions (Signed)
1. You may see some blood in the urine and may have some burning with urination for 48-72 hours. You also may notice that you have to urinate more frequently or urgently after your procedure which is normal.  °2. You should call should you develop an inability urinate, fever > 101, persistent nausea and vomiting that prevents you from eating or drinking to stay hydrated.  °

## 2014-01-19 ENCOUNTER — Encounter (HOSPITAL_COMMUNITY): Payer: Self-pay | Admitting: Urology

## 2014-01-22 ENCOUNTER — Telehealth: Payer: Self-pay | Admitting: Gynecologic Oncology

## 2014-01-22 NOTE — Telephone Encounter (Signed)
Called on Tues, Jan 27 for an update on Dr. Luther Parody recommendations for possible aneursym repair for pt.  Left message with Ulis Rias, RN.  Returned call and stated that she was waiting for Dr. Donnetta Hutching to return in the am.  Spoke with Ulis Rias, RN at Dr. Darlin Priestly office on Wed., Jan 28.  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 Alycia Rossetti and Dr. Alinda Money.  Spoke with the patient's husband on 01/21/14 and follow up appt arranged for Feb with Dr. Alycia Rossetti.  Informed that if there was a cancellation, she would be moved to a sooner date.  Verbalizing understanding.  Advised to call for any questions or concerns.

## 2014-01-29 ENCOUNTER — Ambulatory Visit (HOSPITAL_COMMUNITY): Payer: Commercial Managed Care - PPO

## 2014-02-10 ENCOUNTER — Ambulatory Visit: Payer: Commercial Managed Care - PPO | Admitting: Gynecologic Oncology

## 2014-02-25 ENCOUNTER — Encounter: Payer: Self-pay | Admitting: Gynecologic Oncology

## 2014-02-25 ENCOUNTER — Telehealth: Payer: Self-pay | Admitting: Gynecologic Oncology

## 2014-02-25 ENCOUNTER — Ambulatory Visit: Payer: Commercial Managed Care - PPO | Attending: Gynecologic Oncology | Admitting: Gynecologic Oncology

## 2014-02-25 ENCOUNTER — Ambulatory Visit (HOSPITAL_COMMUNITY)
Admission: RE | Admit: 2014-02-25 | Discharge: 2014-02-25 | Disposition: A | Discharge status: Home / Self Care | Payer: Commercial Managed Care - PPO | Source: Ambulatory Visit | Attending: Gynecologic Oncology | Admitting: Gynecologic Oncology

## 2014-02-25 VITALS — BP 127/72 | HR 100 | Temp 98.3°F | Ht 63.0 in | Wt 191.2 lb

## 2014-02-25 DIAGNOSIS — N838 Other noninflammatory disorders of ovary, fallopian tube and broad ligament: Secondary | ICD-10-CM | POA: Insufficient documentation

## 2014-02-25 DIAGNOSIS — R1900 Intra-abdominal and pelvic swelling, mass and lump, unspecified site: Secondary | ICD-10-CM | POA: Insufficient documentation

## 2014-02-25 DIAGNOSIS — N1330 Unspecified hydronephrosis: Secondary | ICD-10-CM | POA: Insufficient documentation

## 2014-02-25 DIAGNOSIS — Z860100 Personal history of colon polyps, unspecified: Secondary | ICD-10-CM | POA: Insufficient documentation

## 2014-02-25 DIAGNOSIS — Z803 Family history of malignant neoplasm of breast: Secondary | ICD-10-CM | POA: Insufficient documentation

## 2014-02-25 DIAGNOSIS — Z833 Family history of diabetes mellitus: Secondary | ICD-10-CM | POA: Insufficient documentation

## 2014-02-25 DIAGNOSIS — I714 Abdominal aortic aneurysm, without rupture, unspecified: Secondary | ICD-10-CM | POA: Insufficient documentation

## 2014-02-25 DIAGNOSIS — Z79899 Other long term (current) drug therapy: Secondary | ICD-10-CM | POA: Insufficient documentation

## 2014-02-25 DIAGNOSIS — Z888 Allergy status to other drugs, medicaments and biological substances status: Secondary | ICD-10-CM | POA: Insufficient documentation

## 2014-02-25 DIAGNOSIS — E785 Hyperlipidemia, unspecified: Secondary | ICD-10-CM | POA: Insufficient documentation

## 2014-02-25 DIAGNOSIS — F17200 Nicotine dependence, unspecified, uncomplicated: Secondary | ICD-10-CM | POA: Insufficient documentation

## 2014-02-25 DIAGNOSIS — Z8249 Family history of ischemic heart disease and other diseases of the circulatory system: Secondary | ICD-10-CM | POA: Insufficient documentation

## 2014-02-25 DIAGNOSIS — I10 Essential (primary) hypertension: Secondary | ICD-10-CM | POA: Insufficient documentation

## 2014-02-25 NOTE — Telephone Encounter (Signed)
Patient informed of [place] results and Dr. Elenora Gamma recommendations to continue with proceeding with surgery.

## 2014-02-25 NOTE — Patient Instructions (Signed)
Surgery 03/16/14
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
## 2014-03-08 ENCOUNTER — Encounter (HOSPITAL_COMMUNITY): Payer: Self-pay | Admitting: Pharmacy Technician

## 2014-03-10 ENCOUNTER — Encounter (HOSPITAL_COMMUNITY)
Admission: RE | Admit: 2014-03-10 | Discharge: 2014-03-10 | Disposition: A | Discharge status: Home / Self Care | Payer: Commercial Managed Care - PPO | Source: Ambulatory Visit | Attending: Gynecologic Oncology | Admitting: Gynecologic Oncology

## 2014-03-10 ENCOUNTER — Encounter (HOSPITAL_COMMUNITY): Payer: Self-pay

## 2014-03-10 DIAGNOSIS — R1900 Intra-abdominal and pelvic swelling, mass and lump, unspecified site: Secondary | ICD-10-CM | POA: Insufficient documentation

## 2014-03-10 DIAGNOSIS — Z01812 Encounter for preprocedural laboratory examination: Secondary | ICD-10-CM | POA: Diagnosis present

## 2014-03-10 HISTORY — DX: Personal history of other medical treatment: Z92.89

## 2014-03-10 LAB — CBC WITH DIFFERENTIAL/PLATELET
Basophils Absolute: 0.1 10*3/uL (ref 0.0–0.1)
Basophils Relative: 1 % (ref 0–1)
EOS PCT: 3 % (ref 0–5)
Eosinophils Absolute: 0.3 10*3/uL (ref 0.0–0.7)
HEMATOCRIT: 45 % (ref 36.0–46.0)
Hemoglobin: 15.4 g/dL — ABNORMAL HIGH (ref 12.0–15.0)
LYMPHS PCT: 40 % (ref 12–46)
Lymphs Abs: 3.6 10*3/uL (ref 0.7–4.0)
MCH: 31.4 pg (ref 26.0–34.0)
MCHC: 34.2 g/dL (ref 30.0–36.0)
MCV: 91.8 fL (ref 78.0–100.0)
MONO ABS: 0.5 10*3/uL (ref 0.1–1.0)
MONOS PCT: 6 % (ref 3–12)
Neutro Abs: 4.5 10*3/uL (ref 1.7–7.7)
Neutrophils Relative %: 50 % (ref 43–77)
Platelets: 226 10*3/uL (ref 150–400)
RBC: 4.9 MIL/uL (ref 3.87–5.11)
RDW: 13.6 % (ref 11.5–15.5)
WBC: 9 10*3/uL (ref 4.0–10.5)

## 2014-03-10 LAB — COMPREHENSIVE METABOLIC PANEL
ALT: 19 U/L (ref 0–35)
AST: 23 U/L (ref 0–37)
Albumin: 3.8 g/dL (ref 3.5–5.2)
Alkaline Phosphatase: 90 U/L (ref 39–117)
BUN: 23 mg/dL (ref 6–23)
CALCIUM: 9.6 mg/dL (ref 8.4–10.5)
CO2: 24 meq/L (ref 19–32)
CREATININE: 0.89 mg/dL (ref 0.50–1.10)
Chloride: 102 mEq/L (ref 96–112)
GFR, EST AFRICAN AMERICAN: 84 mL/min — AB (ref >90)
GFR, EST NON AFRICAN AMERICAN: 73 mL/min — AB (ref >90)
GLUCOSE: 95 mg/dL (ref 70–99)
Potassium: 4.2 mEq/L (ref 3.7–5.3)
Sodium: 142 mEq/L (ref 137–147)
Total Bilirubin: <0.2 mg/dL — ABNORMAL LOW (ref 0.3–1.2)
Total Protein: 7.6 g/dL (ref 6.0–8.3)

## 2014-03-10 LAB — URINALYSIS, ROUTINE W REFLEX MICROSCOPIC
Bilirubin Urine: NEGATIVE
Glucose, UA: NEGATIVE mg/dL
Hgb urine dipstick: NEGATIVE
Ketones, ur: NEGATIVE mg/dL
LEUKOCYTES UA: NEGATIVE
Nitrite: NEGATIVE
Protein, ur: NEGATIVE mg/dL
SPECIFIC GRAVITY, URINE: 1.012 (ref 1.005–1.030)
UROBILINOGEN UA: 0.2 mg/dL (ref 0.0–1.0)
pH: 7 (ref 5.0–8.0)

## 2014-03-10 LAB — ABO/RH: ABO/RH(D): O POS

## 2014-03-10 NOTE — Patient Instructions (Addendum)
DEREONA KOLODNY  03/10/2014                           YOUR PROCEDURE IS SCHEDULED ON: 03/16/14               PLEASE REPORT TO SHORT STAY CENTER AT : 8:00 AM               CALL THIS NUMBER IF ANY PROBLEMS THE DAY OF SURGERY :               [phone]                                REMEMBER:   Do not eat food or drink liquids AFTER MIDNIGHT   May have clear liquids UNTIL 6 HOURS BEFORE SURGERY (5:00 AM)               Take these medicines the morning of surgery with A SIP OF WATER: METOPROLOL   Do not wear jewelry, make-up   Do not wear lotions, powders, or perfumes.   Do not shave legs or underarms 12 hrs. before surgery (men may shave face)  Do not bring valuables to the hospital.  Contacts, dentures or bridgework may not be worn into surgery.  Leave suitcase in the car. After surgery it may be brought to your room.  For patients admitted to the hospital more than one night, checkout time is            11:00 AM                                                       The day of discharge.   Patients discharged the day of surgery will not be allowed to drive home.            If going home same day of surgery, must have someone stay with you              FIRST 24 hrs at home and arrange for some one to drive you              home from hospital.    Special Instructions             Please read over the following fact sheets that you were given:               1. [place]                2. INCENTIVE SPIROMETER                3. CLEAR LIQUIDS ONLY 24 HRS PREOP                                                X_____________________________________________________________________        Failure to follow these instructions may result in cancellation of your surgery

## 2014-03-10 NOTE — Progress Notes (Signed)
03/10/14 1327  OBSTRUCTIVE SLEEP APNEA  Have you ever been diagnosed with sleep apnea through a sleep study? No  Do you snore loudly (loud enough to be heard through closed doors)?  1  Do you often feel tired, fatigued, or sleepy during the daytime? 1  Has anyone observed you stop breathing during your sleep? 0  Do you have, or are you being treated for high blood pressure? 1  BMI more than 35 kg/m2? 0  Age over 54 years old? 1  Neck circumference greater than 40 cm/18 inches? 0  Gender: 0  Obstructive Sleep Apnea Score 4  Score 4 or greater  Results sent to PCP

## 2014-03-15 ENCOUNTER — Telehealth: Payer: Self-pay | Admitting: *Deleted

## 2014-03-15 NOTE — Telephone Encounter (Signed)
Called pt reviewed NPO After midnight, clear liquids today. Pt advised she broke her tooth yesterday and will be going to dentist to get fixed today. No further concern

## 2014-03-16 ENCOUNTER — Encounter (HOSPITAL_COMMUNITY)
Admission: RE | Disposition: A | Discharge status: Home / Self Care | Payer: Self-pay | Source: Ambulatory Visit | Attending: Obstetrics & Gynecology

## 2014-03-16 ENCOUNTER — Encounter (HOSPITAL_COMMUNITY): Payer: Commercial Managed Care - PPO | Admitting: Anesthesiology

## 2014-03-16 ENCOUNTER — Ambulatory Visit (HOSPITAL_COMMUNITY): Payer: Commercial Managed Care - PPO | Admitting: Anesthesiology

## 2014-03-16 ENCOUNTER — Ambulatory Visit (HOSPITAL_COMMUNITY)
Admission: RE | Admit: 2014-03-16 | Discharge: 2014-03-16 | Disposition: A | Discharge status: Home / Self Care | Payer: Commercial Managed Care - PPO | Source: Ambulatory Visit | Attending: Obstetrics & Gynecology | Admitting: Obstetrics & Gynecology

## 2014-03-16 ENCOUNTER — Encounter (HOSPITAL_COMMUNITY): Payer: Self-pay | Admitting: *Deleted

## 2014-03-16 DIAGNOSIS — I10 Essential (primary) hypertension: Secondary | ICD-10-CM | POA: Insufficient documentation

## 2014-03-16 DIAGNOSIS — IMO0002 Reserved for concepts with insufficient information to code with codable children: Secondary | ICD-10-CM | POA: Insufficient documentation

## 2014-03-16 DIAGNOSIS — I714 Abdominal aortic aneurysm, without rupture, unspecified: Secondary | ICD-10-CM | POA: Insufficient documentation

## 2014-03-16 DIAGNOSIS — K589 Irritable bowel syndrome without diarrhea: Secondary | ICD-10-CM | POA: Insufficient documentation

## 2014-03-16 DIAGNOSIS — E739 Lactose intolerance, unspecified: Secondary | ICD-10-CM | POA: Insufficient documentation

## 2014-03-16 DIAGNOSIS — K5900 Constipation, unspecified: Secondary | ICD-10-CM | POA: Insufficient documentation

## 2014-03-16 DIAGNOSIS — F17200 Nicotine dependence, unspecified, uncomplicated: Secondary | ICD-10-CM | POA: Insufficient documentation

## 2014-03-16 DIAGNOSIS — Z803 Family history of malignant neoplasm of breast: Secondary | ICD-10-CM | POA: Insufficient documentation

## 2014-03-16 DIAGNOSIS — Z79899 Other long term (current) drug therapy: Secondary | ICD-10-CM | POA: Insufficient documentation

## 2014-03-16 DIAGNOSIS — E785 Hyperlipidemia, unspecified: Secondary | ICD-10-CM | POA: Insufficient documentation

## 2014-03-16 DIAGNOSIS — Z780 Asymptomatic menopausal state: Secondary | ICD-10-CM | POA: Insufficient documentation

## 2014-03-16 DIAGNOSIS — N83209 Unspecified ovarian cyst, unspecified side: Secondary | ICD-10-CM | POA: Insufficient documentation

## 2014-03-16 DIAGNOSIS — I739 Peripheral vascular disease, unspecified: Secondary | ICD-10-CM | POA: Insufficient documentation

## 2014-03-16 DIAGNOSIS — D279 Benign neoplasm of unspecified ovary: Secondary | ICD-10-CM | POA: Insufficient documentation

## 2014-03-16 DIAGNOSIS — R1900 Intra-abdominal and pelvic swelling, mass and lump, unspecified site: Secondary | ICD-10-CM

## 2014-03-16 HISTORY — PX: ROBOTIC ASSISTED TOTAL HYSTERECTOMY WITH BILATERAL SALPINGO OOPHERECTOMY: SHX6086

## 2014-03-16 LAB — TYPE AND SCREEN
ABO/RH(D): O POS
ANTIBODY SCREEN: POSITIVE
DAT, IgG: NEGATIVE

## 2014-03-16 SURGERY — ROBOTIC ASSISTED TOTAL HYSTERECTOMY WITH BILATERAL SALPINGO OOPHORECTOMY
Anesthesia: General | Laterality: Bilateral

## 2014-03-16 MED ORDER — HYDROMORPHONE HCL 2 MG PO TABS: 4.0000 mg | ORAL_TABLET | ORAL | Status: DC | PRN

## 2014-03-16 MED ORDER — CEFAZOLIN SODIUM-DEXTROSE 2-3 GM-% IV SOLR
INTRAVENOUS | Status: AC
  Filled 2014-03-16: qty 50

## 2014-03-16 MED ORDER — NEOSTIGMINE METHYLSULFATE 1 MG/ML IJ SOLN
INTRAMUSCULAR | Status: AC
  Filled 2014-03-16: qty 10

## 2014-03-16 MED ORDER — SODIUM CHLORIDE 0.9 % IJ SOLN: 3.0000 mL | INTRAMUSCULAR | Status: DC | PRN

## 2014-03-16 MED ORDER — ROCURONIUM BROMIDE 100 MG/10ML IV SOLN
INTRAVENOUS | Status: AC
  Filled 2014-03-16: qty 1

## 2014-03-16 MED ORDER — ROCURONIUM BROMIDE 100 MG/10ML IV SOLN
INTRAVENOUS | Status: DC | PRN
  Administered 2014-03-16: 10 mg via INTRAVENOUS
  Administered 2014-03-16: 30 mg via INTRAVENOUS

## 2014-03-16 MED ORDER — HYDROMORPHONE HCL PF 1 MG/ML IJ SOLN
INTRAMUSCULAR | Status: AC
  Filled 2014-03-16: qty 1

## 2014-03-16 MED ORDER — MIDAZOLAM HCL 2 MG/2ML IJ SOLN
INTRAMUSCULAR | Status: AC
  Filled 2014-03-16: qty 2

## 2014-03-16 MED ORDER — LACTATED RINGERS IV SOLN
INTRAVENOUS | Status: DC
Start: 2014-03-16 — End: 2014-03-16
  Administered 2014-03-16: 12:00:00 via INTRAVENOUS
  Administered 2014-03-16: 1000 mL via INTRAVENOUS

## 2014-03-16 MED ORDER — DEXAMETHASONE SODIUM PHOSPHATE 10 MG/ML IJ SOLN
INTRAMUSCULAR | Status: AC
  Filled 2014-03-16: qty 1

## 2014-03-16 MED ORDER — FENTANYL CITRATE 0.05 MG/ML IJ SOLN
INTRAMUSCULAR | Status: DC | PRN
  Administered 2014-03-16 (×5): 50 ug via INTRAVENOUS

## 2014-03-16 MED ORDER — PROMETHAZINE HCL 25 MG/ML IJ SOLN: 6.2500 mg | INTRAMUSCULAR | Status: DC | PRN

## 2014-03-16 MED ORDER — GLYCOPYRROLATE 0.2 MG/ML IJ SOLN
INTRAMUSCULAR | Status: AC
  Filled 2014-03-16: qty 3

## 2014-03-16 MED ORDER — SUCCINYLCHOLINE CHLORIDE 20 MG/ML IJ SOLN
INTRAMUSCULAR | Status: AC
  Filled 2014-03-16: qty 1

## 2014-03-16 MED ORDER — STERILE WATER FOR IRRIGATION IR SOLN
Status: DC | PRN
  Administered 2014-03-16: 3000 mL

## 2014-03-16 MED ORDER — ONDANSETRON HCL 4 MG/2ML IJ SOLN
INTRAMUSCULAR | Status: AC
  Filled 2014-03-16: qty 2

## 2014-03-16 MED ORDER — ACETAMINOPHEN 650 MG RE SUPP
650.0000 mg | RECTAL | Status: DC | PRN
  Filled 2014-03-16: qty 1

## 2014-03-16 MED ORDER — ACETAMINOPHEN 325 MG PO TABS: 650.0000 mg | ORAL_TABLET | ORAL | Status: DC | PRN

## 2014-03-16 MED ORDER — HYDROMORPHONE HCL 2 MG PO TABS: 2.0000 mg | ORAL_TABLET | Freq: Four times a day (QID) | ORAL | Status: AC | PRN

## 2014-03-16 MED ORDER — SUCCINYLCHOLINE CHLORIDE 20 MG/ML IJ SOLN
INTRAMUSCULAR | Status: DC | PRN
  Administered 2014-03-16: 100 mg via INTRAVENOUS

## 2014-03-16 MED ORDER — GLYCOPYRROLATE 0.2 MG/ML IJ SOLN
INTRAMUSCULAR | Status: DC | PRN
  Administered 2014-03-16: 0.6 mg via INTRAVENOUS

## 2014-03-16 MED ORDER — DEXAMETHASONE SODIUM PHOSPHATE 10 MG/ML IJ SOLN
INTRAMUSCULAR | Status: DC | PRN
  Administered 2014-03-16: 10 mg via INTRAVENOUS

## 2014-03-16 MED ORDER — ONDANSETRON HCL 4 MG/2ML IJ SOLN
INTRAMUSCULAR | Status: DC | PRN
  Administered 2014-03-16: 4 mg via INTRAVENOUS

## 2014-03-16 MED ORDER — PROPOFOL 10 MG/ML IV BOLUS
INTRAVENOUS | Status: AC
  Filled 2014-03-16: qty 20

## 2014-03-16 MED ORDER — HYDROMORPHONE HCL PF 1 MG/ML IJ SOLN
0.2500 mg | INTRAMUSCULAR | Status: DC | PRN
  Administered 2014-03-16: 0.5 mg via INTRAVENOUS

## 2014-03-16 MED ORDER — NEOSTIGMINE METHYLSULFATE 1 MG/ML IJ SOLN
INTRAMUSCULAR | Status: DC | PRN
  Administered 2014-03-16: 4 mg via INTRAVENOUS

## 2014-03-16 MED ORDER — MIDAZOLAM HCL 5 MG/5ML IJ SOLN
INTRAMUSCULAR | Status: DC | PRN
  Administered 2014-03-16: 2 mg via INTRAVENOUS

## 2014-03-16 MED ORDER — LACTATED RINGERS IR SOLN
Status: DC | PRN
  Administered 2014-03-16: 1000 mL

## 2014-03-16 MED ORDER — SODIUM CHLORIDE 0.9 % IJ SOLN
3.0000 mL | Freq: Two times a day (BID) | INTRAMUSCULAR | Status: DC
Start: 2014-03-16 — End: 2014-03-16

## 2014-03-16 MED ORDER — FENTANYL CITRATE 0.05 MG/ML IJ SOLN
INTRAMUSCULAR | Status: AC
  Filled 2014-03-16: qty 2

## 2014-03-16 MED ORDER — FENTANYL CITRATE 0.05 MG/ML IJ SOLN
INTRAMUSCULAR | Status: AC
  Filled 2014-03-16: qty 5

## 2014-03-16 MED ORDER — PROPOFOL 10 MG/ML IV BOLUS
INTRAVENOUS | Status: DC | PRN
  Administered 2014-03-16: 150 mg via INTRAVENOUS

## 2014-03-16 MED ORDER — SODIUM CHLORIDE 0.9 % IV SOLN: 250.0000 mL | INTRAVENOUS | Status: DC | PRN

## 2014-03-16 SURGICAL SUPPLY — 59 items
APL SKNCLS STERI-STRIP NONHPOA (GAUZE/BANDAGES/DRESSINGS)
BAG SPEC RTRVL LRG 6X4 10 (ENDOMECHANICALS) ×2
BENZOIN TINCTURE PRP APPL 2/3 (GAUZE/BANDAGES/DRESSINGS) ×1 IMPLANT
CABLE HIGH FREQUENCY MONO STRZ (ELECTRODE) ×3 IMPLANT
CHLORAPREP W/TINT 26ML (MISCELLANEOUS) ×3 IMPLANT
CLOSURE WOUND 1/2 X4 (GAUZE/BANDAGES/DRESSINGS)
CORDS BIPOLAR (ELECTRODE) ×3 IMPLANT
COVER MAYO STAND STRL (DRAPES) ×3 IMPLANT
COVER SURGICAL LIGHT HANDLE (MISCELLANEOUS) ×3 IMPLANT
COVER TIP SHEARS 8 DVNC (MISCELLANEOUS) ×1 IMPLANT
COVER TIP SHEARS 8MM DA VINCI (MISCELLANEOUS) ×2
DRAPE LG THREE QUARTER DISP (DRAPES) ×6 IMPLANT
DRAPE SURG IRRIG POUCH 19X23 (DRAPES) ×3 IMPLANT
DRAPE TABLE BACK 44X90 PK DISP (DRAPES) ×6 IMPLANT
DRAPE UTILITY XL STRL (DRAPES) ×3 IMPLANT
DRAPE WARM FLUID 44X44 (DRAPE) ×3 IMPLANT
DRSG TEGADERM 2-3/8X2-3/4 SM (GAUZE/BANDAGES/DRESSINGS) ×9 IMPLANT
DRSG TEGADERM 4X4.75 (GAUZE/BANDAGES/DRESSINGS) ×3 IMPLANT
DRSG TEGADERM 6X8 (GAUZE/BANDAGES/DRESSINGS) ×6 IMPLANT
ELECT REM PT RETURN 9FT ADLT (ELECTROSURGICAL) ×3
ELECTRODE REM PT RTRN 9FT ADLT (ELECTROSURGICAL) ×1 IMPLANT
GAUZE SPONGE 2X2 8PLY STRL LF (GAUZE/BANDAGES/DRESSINGS) ×1 IMPLANT
GLOVE BIO SURGEON STRL SZ 6.5 (GLOVE) ×4 IMPLANT
GLOVE BIO SURGEON STRL SZ7.5 (GLOVE) ×6 IMPLANT
GLOVE BIO SURGEONS STRL SZ 6.5 (GLOVE) ×2
GLOVE BIOGEL PI IND STRL 7.0 (GLOVE) ×2 IMPLANT
GLOVE BIOGEL PI INDICATOR 7.0 (GLOVE) ×4
GOWN STRL REUS W/ TWL XL LVL3 (GOWN DISPOSABLE) ×2 IMPLANT
GOWN STRL REUS W/TWL LRG LVL3 (GOWN DISPOSABLE) ×9 IMPLANT
GOWN STRL REUS W/TWL XL LVL3 (GOWN DISPOSABLE) ×6
HOLDER FOLEY CATH W/STRAP (MISCELLANEOUS) ×3 IMPLANT
KIT ACCESSORY DA VINCI DISP (KITS) ×2
KIT ACCESSORY DVNC DISP (KITS) ×1 IMPLANT
KIT BASIN OR (CUSTOM PROCEDURE TRAY) ×3 IMPLANT
MANIPULATOR UTERINE 4.5 ZUMI (MISCELLANEOUS) ×3 IMPLANT
OCCLUDER COLPOPNEUMO (BALLOONS) ×3 IMPLANT
POUCH SPECIMEN RETRIEVAL 10MM (ENDOMECHANICALS) ×6 IMPLANT
SET TUBE IRRIG SUCTION NO TIP (IRRIGATION / IRRIGATOR) ×3 IMPLANT
SHEET LAVH (DRAPES) ×3 IMPLANT
SOLUTION ANTI FOG 6CC (MISCELLANEOUS) ×3 IMPLANT
SOLUTION ELECTROLUBE (MISCELLANEOUS) ×3 IMPLANT
SPONGE GAUZE 2X2 STER 10/PKG (GAUZE/BANDAGES/DRESSINGS) ×2
SPONGE LAP 18X18 X RAY DECT (DISPOSABLE) IMPLANT
STRIP CLOSURE SKIN 1/2X4 (GAUZE/BANDAGES/DRESSINGS) ×1 IMPLANT
SUT VIC AB 0 CT1 27 (SUTURE) ×9
SUT VIC AB 0 CT1 27XBRD ANTBC (SUTURE) ×3 IMPLANT
SUT VIC AB 4-0 PS2 27 (SUTURE) ×6 IMPLANT
SUT VICRYL 0 UR6 27IN ABS (SUTURE) ×3 IMPLANT
SYR 50ML LL SCALE MARK (SYRINGE) ×3 IMPLANT
SYR BULB IRRIGATION 50ML (SYRINGE) IMPLANT
TOWEL OR 17X26 10 PK STRL BLUE (TOWEL DISPOSABLE) ×6 IMPLANT
TRAP SPECIMEN MUCOUS 40CC (MISCELLANEOUS) ×3 IMPLANT
TRAY FOLEY CATH 14FRSI W/METER (CATHETERS) ×3 IMPLANT
TRAY LAP CHOLE (CUSTOM PROCEDURE TRAY) ×3 IMPLANT
TROCAR 12M 150ML BLUNT (TROCAR) ×3 IMPLANT
TROCAR BLADELESS OPT 5 100 (ENDOMECHANICALS) ×3 IMPLANT
TROCAR XCEL 12X100 BLDLESS (ENDOMECHANICALS) ×3 IMPLANT
TUBING INSUFFLATION 10FT LAP (TUBING) ×3 IMPLANT
WATER STERILE IRR 1500ML POUR (IV SOLUTION) ×2 IMPLANT

## 2014-03-16 NOTE — Interval H&P Note (Signed)
History and Physical Interval Note:  03/16/2014 9:35 AM  [name]  has presented today for surgery, with the diagnosis of pelvic mass  The various methods of treatment have been discussed with the patient and family. After consideration of risks, benefits and other options for treatment, the patient has consented to  Procedure(s): ROBOTIC ASSISTED BILATERAL SALPINGO OOPHORECTOMY WITH POSSIBLE HYSTERECTOMY AND STAGING (Bilateral) as a surgical intervention .  The patient's history has been reviewed, patient examined, no change in status, stable for surgery.  I have reviewed the patient's chart and labs.  Questions were answered to the patient's satisfaction.     [name]

## 2014-03-16 NOTE — Discharge Instructions (Signed)
03/16/2014  Return to work: 1 - 2 weeks  Activity: 1. Be up and out of the bed during the day.  Take a nap if needed.  You may walk up steps but be careful and use the hand rail.  Stair climbing will tire you more than you think, you may need to stop part way and rest.   2. Do Not drive if you are taking narcotic pain medicine.  3. Shower daily.  Use soap and water on your incision and pat dry; don't rub.   5. No sexual activity and nothing in the vagina for 6 weeks.  Diet: 1. Low sodium Heart Healthy Diet is recommended.  2. It is safe to use a laxative if you have difficulty moving your bowels.   Wound Care: 1. Keep clean and dry.  Shower daily.  Reasons to call the Doctor:   Fever - Oral temperature greater than 100.4 degrees Fahrenheit  Foul-smelling vaginal discharge  Difficulty urinating  Nausea and vomiting  Increased pain at the site of the incision that is unrelieved with pain medicine.  Difficulty breathing with or without chest pain  New calf pain especially if only on one side  Sudden, continuing increased vaginal bleeding with or without clots.     Contacts: For questions or concerns you should contact:  Dr. Lahoma Crocker at [phone]  Dr. Nancy Marus at [facility] [phone]  Bilateral Salpingo-Oophorectomy, Care After Refer to this sheet in the next few weeks. These instructions provide you with information on caring for yourself after your procedure. Your health care provider may also give you more specific instructions. Your treatment has been planned according to current medical practices, but problems sometimes occur. Call your health care provider if you have any problems or questions after your procedure. WHAT TO EXPECT AFTER THE PROCEDURE After your procedure, it is typical to have the following:   Abdominal pain that can be controlled with medicine.  Vaginal spotting.  Constipation.  Menopausal symptoms such as hot flashes,  vaginal dryness, and mood swings. HOME CARE INSTRUCTIONS   Get plenty of rest and sleep.  Only take over-the-counter or prescription medicines as directed by your health care provider. Do not take aspirin. It can cause bleeding.  Keep incision areas clean and dry. Remove or change bandages (dressings) only as directed by your health care provider.  Take showers instead of baths for a few weeks as directed by your health care provider.  Limit exercise and activities as directed by your health care provider. Do not lift anything heavier than 5 pounds (2.3 kg) until your health care provider approves.  Do not drive until your health care provider approves.  Follow your health care provider's advice regarding diet. You may be able to resume your usual diet right away.  Drink enough fluids to keep your urine clear or pale yellow.  Do not douche, use tampons, or have sexual intercourse for 6 weeks after the procedure.  Do not drink alcohol until your health care provider says it is okay.  Take your temperature twice a day and write it down.  If you become constipated, you may:  Ask your health care provider about taking a mild laxative.  Add more fruit and bran to your diet.  Drink more fluids.  Follow up with your health care provider as directed. SEEK MEDICAL CARE IF:   You have swelling, redness, or increasing pain in the incision area.  You see pus coming from the incision area.  You  notice a bad smell coming from the wound or dressing.  You have pain, redness, or swelling where the IV access tube was placed.  Your incision is breaking open (the edges are not staying together).  You feel dizzy or feel like fainting.  You develop pain or bleeding when you urinate.  You develop diarrhea.  You develop nausea and vomiting.  You develop abnormal vaginal discharge.  You develop a rash.  You have pain that is not controlled with medicine. SEEK IMMEDIATE MEDICAL CARE  IF:   You develop a fever.  You develop abdominal pain.  You have chest pain.  You develop shortness of breath.  You pass out.  You develop pain, swelling, or redness in your leg.  You develop heavy vaginal bleeding with or without blood clots. Document Released: 12/10/2005 Document Revised: 08/12/2013 Document Reviewed: 06/03/2013 [facility] Patient Information 2014 [place].

## 2014-03-16 NOTE — Anesthesia Preprocedure Evaluation (Addendum)
Anesthesia Evaluation  Patient identified by MRN, date of birth, ID band Patient awake    Reviewed: Allergy & Precautions, H&P , NPO status , Patient's Chart, lab work & pertinent test results  History of Anesthesia Complications (+) history of anesthetic complications  Airway Mallampati: II TM Distance: >3 FB Neck ROM: Full    Dental no notable dental hx.    Pulmonary neg pulmonary ROS, Current Smoker,  breath sounds clear to auscultation  Pulmonary exam normal       Cardiovascular hypertension, Pt. on medications and Pt. on home beta blockers + Peripheral Vascular Disease Rhythm:Regular Rate:Normal  AAA. Being followed by Dr. Donnetta Hutching. Last visit in December 2014   Neuro/Psych  Headaches, negative psych ROS   GI/Hepatic negative GI ROS, Neg liver ROS,   Endo/Other  negative endocrine ROS  Renal/GU Renal disease  negative genitourinary   Musculoskeletal negative musculoskeletal ROS (+)   Abdominal (+) + obese,   Peds negative pediatric ROS (+)  Hematology negative hematology ROS (+)   Anesthesia Other Findings   Reproductive/Obstetrics negative OB ROS                         Anesthesia Physical Anesthesia Plan  ASA: III  Anesthesia Plan: General   Post-op Pain Management:    Induction: Intravenous  Airway Management Planned: Oral ETT  Additional Equipment:   Intra-op Plan:   Post-operative Plan: Extubation in OR  Informed Consent: I have reviewed the patients History and Physical, chart, labs and discussed the procedure including the risks, benefits and alternatives for the proposed anesthesia with the patient or authorized representative who has indicated his/her understanding and acceptance.   Dental advisory given  Plan Discussed with: CRNA  Anesthesia Plan Comments:         Anesthesia Quick Evaluation

## 2014-03-16 NOTE — Anesthesia Postprocedure Evaluation (Signed)
  Anesthesia Post-op Note  Patient: [name]  Procedure(s) Performed: Procedure(s) (LRB): ROBOTIC ASSISTED BILATERAL SALPINGO OOPHORECTOMY (Bilateral)  Patient Location: PACU  Anesthesia Type: General  Level of Consciousness: awake and alert   Airway and Oxygen Therapy: Patient Spontanous Breathing  Post-op Pain: mild  Post-op Assessment: Post-op Vital signs reviewed, Patient's Cardiovascular Status Stable, Respiratory Function Stable, Patent Airway and No signs of Nausea or vomiting  Last Vitals:  Filed Vitals:   03/16/14 1400  BP: 126/48  Pulse: 61  Temp: 36.1 C  Resp: 18    Post-op Vital Signs: stable   Complications: No apparent anesthesia complications

## 2014-03-16 NOTE — Transfer of Care (Signed)
Immediate Anesthesia Transfer of Care Note  Patient: [name]  Procedure(s) Performed: Procedure(s): ROBOTIC ASSISTED BILATERAL SALPINGO OOPHORECTOMY (Bilateral)  Patient Location: PACU  Anesthesia Type:General  Level of Consciousness: awake, alert , oriented and patient cooperative  Airway & Oxygen Therapy: Patient Spontanous Breathing and Patient connected to face mask oxygen  Post-op Assessment: Report given to PACU RN and Post -op Vital signs reviewed and stable  Post vital signs: Reviewed and stable  Complications: No apparent anesthesia complications

## 2014-03-16 NOTE — Op Note (Signed)
PATIENT: [name] DATE OF BIRTH: 21-May-1961 ENCOUNTER DATE: 03/16/2014   Preop Diagnosis: Right pelvic mass  Postoperative Diagnosis: Mucinous cystadenoma  Surgery: Robotic bilateral salpingo-oophorectomy    Surgeons:  Aretta Stetzel A. Alycia Rossetti, MD; Lahoma Crocker, MD   Anesthesia: General   Estimated blood loss: 15  ml   IVF: 1000 ml   Urine output: 50 ml   Complications: None   Pathology: Bilateral tubes and ovaries   Operative findings: 10 cm right simple appearing adnexal mass, normal left adnexa, small uterus, adhesions of rectosigmoid colon to left pelvic sidewall. Evidence of diverticular disease.  Procedure: The patient was identified in the preoperative holding area. Informed consent was signed on the chart. Patient was seen history was reviewed and exam was performed.   The patient was then taken to the operating room and placed in the supine position with SCD hose on. She was then placed in the dorsolithotomy position. Her arms were tucked at her side with appropriate precautions on the gel pad. General anesthesia was then induced without difficulty. Shoulder blocks were then placed in the usual fashion with appropriate precautions. A OG-tube was placed to suction. First timeout was performed to confirm the patient, procedure, antibiotic, allergy status, estimated blood loss and OR time. The perineum was then prepped in the usual fashion with Betadine. A 14 French Foley was inserted into the bladder under sterile conditions. A sterile speculum was placed in the vagina. The cervix was without lesions. The cervix was grasped with a single-tooth tenaculum. The dilator without difficulty. We could not place the uterine manipulator as the cervix would not dilate sufficiently and we did not want to perforate the uterus. The abdomen was then prepped with 1 Chlor prep sponge per protocol.   Patient was then draped after the prep was dried. Second timeout was performed to confirm the  above. After again confirming OG tube placement and it was to suction. A stab-wound was made in left upper quadrant 2 cm below the costal margin on the left in the midclavicular line. A 5 mm operative report was used to assure intra-abdominal placement. The abdomen was insufflated. At this point all points during the procedure the patient's intra-abdominal pressure was not increased over 15 mm of mercury. After insufflation was complete, the patient was placed in deep Trendelenburg position. 25 cm above the pubic symphysis that area was marked the camera port. Bilateral robotic ports were marked 10 cm from the midline incision at approximately 5 angle. Under direct visualization each of the trochars was placed into the abdomen. The small bowel was folded on its mesentery to allow visualization to the pelvis. The 5 mm LUQ port was then converted to a 10/12 port under direct visualization.  After assuring adequate visualization, the robot was then docked in the usual fashion. Under direct visualization the robotic instruments replaced. Washings were obtained and the adhesions were taken down with sharp dissection.  The posterior leaves of the broad ligament on the right was taken down in the usual fashion. The ureter was identified on the medial leaf of the broad ligament. A window was made between the IP and the ureter. The IP was coagulated with bipolar cautery and transected. The uteroovarian was skeletonized and cauterized.  The right ovarian mass was drained under controlled conditions and placed in an endocatch bag. It was delivered through the 10/12 port and sent for frozen section. The same procedure was performed on the left side.  At this point frozen section returned as  a mucinous cystadenoma. The abdomen and pelvis were copiously irrigated and noted to be hemostatic. The robotic instruments were removed under direct visualization as were the robotic trochars. The pneumoperitoneum was removed. The  patient was then taken out of the Trendelenburg position. Using of 0 Vicryl on a UR 6 needle the midline port fascia was closed after being grasped with allis clamps. The subcutaneous tissues of the port in the left upper quadrant was reapproximated. The skin incisions were closed with dermabond.  All instrument needle and Ray-Tec counts were correct x2. The patient tolerated the procedure well and was taken to the recovery room in stable condition. This is Nancy Marus dictating an operative note on patient [name].

## 2014-03-16 NOTE — Preoperative (Signed)
Beta Blockers   Reason not to administer Beta Blockers:Not Applicable 
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
## 2014-03-17 ENCOUNTER — Encounter (HOSPITAL_COMMUNITY): Payer: Self-pay | Admitting: Gynecologic Oncology

## 2014-03-17 ENCOUNTER — Telehealth: Payer: Self-pay | Admitting: *Deleted

## 2014-03-17 NOTE — Telephone Encounter (Signed)
Called pt notified of appt on 4/22 at 2:45 to arrive at 215 to register. Pt advised she is having some spotting, reassured pt this is normal. Pt voiced concern about being out of work x2weeks and if she needed additional time due to f/u appt is 4/22. Discussed with pt we can write her out of work if needed, sometimes pt's go back to work at 2 weeks. Pt to continue to rest, ambulate, encouraged fluids, pain pills if needed, take stool softener for constipation and as she recovers to see how she feels and let us know if she needs additional time to recover prior to going back to work. Pt advised she will have additional help at work if need ed to do her job and follow post op protocols ( no lifting over 5lbs etc). No further concerns at this time. Pt verbalized understanding and will call with any concerns.

## 2014-03-18 ENCOUNTER — Telehealth: Payer: Self-pay | Admitting: Gynecologic Oncology

## 2014-03-18 NOTE — Telephone Encounter (Signed)
Patient notified of final path report.  No concerns voiced.  "I am doing great since surgery."  Advised to call for any questions or concerns.

## 2014-03-20 LAB — TYPE AND SCREEN
ABO/RH(D): O POS
Antibody Screen: POSITIVE
DAT, IgG: NEGATIVE
DONOR AG TYPE: NEGATIVE
Donor AG Type: NEGATIVE
UNIT DIVISION: 0
UNIT DIVISION: 0

## 2014-03-31 ENCOUNTER — Telehealth: Payer: Self-pay | Admitting: *Deleted

## 2014-03-31 NOTE — Telephone Encounter (Signed)
Pt called with request for letter to return to work. Letter faxed to [employer] [phone]

## 2014-04-02 ENCOUNTER — Telehealth: Payer: Self-pay | Admitting: *Deleted

## 2014-04-02 NOTE — Telephone Encounter (Signed)
Letter refaxedwith pt restricitions and time frame faxed to pt's place of work. Fax # [phone].

## 2014-04-14 ENCOUNTER — Encounter: Payer: Self-pay | Admitting: Gynecologic Oncology

## 2014-04-14 ENCOUNTER — Ambulatory Visit: Payer: Commercial Managed Care - PPO | Attending: Gynecologic Oncology | Admitting: Gynecologic Oncology

## 2014-04-14 VITALS — BP 143/58 | HR 87 | Temp 98.7°F | Resp 16 | Ht 63.0 in | Wt 188.2 lb

## 2014-04-14 DIAGNOSIS — R1900 Intra-abdominal and pelvic swelling, mass and lump, unspecified site: Secondary | ICD-10-CM

## 2014-04-14 DIAGNOSIS — N9489 Other specified conditions associated with female genital organs and menstrual cycle: Secondary | ICD-10-CM | POA: Insufficient documentation
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
## 2014-04-14 NOTE — Patient Instructions (Addendum)
Follow-up when necessary

## 2014-05-31 IMAGING — US US PELVIS COMPLETE
1 series · 13 of 25 positions shown · non-contrast
Comparison: CT 11/13/2013

CLINICAL DATA: Right ovarian mass.

EXAM:
TRANSABDOMINAL AND TRANSVAGINAL ULTRASOUND OF PELVIS
TECHNIQUE: Both transabdominal and transvaginal ultrasound examinations of the
pelvis were performed. Transabdominal technique was performed for
global imaging of the pelvis including uterus, ovaries, adnexal
regions, and pelvic cul-de-sac. It was necessary to proceed with
endovaginal exam following the transabdominal exam to visualize the
endometrium and ovaries.

[Series 1: us pelvis complete · 0.24mm/px · 13 of 98 slices shown]
[im 1/98]
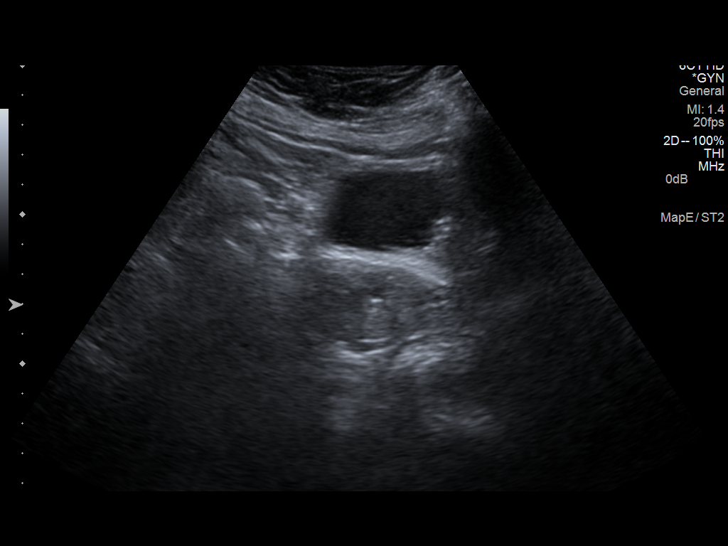
[im 9/98]
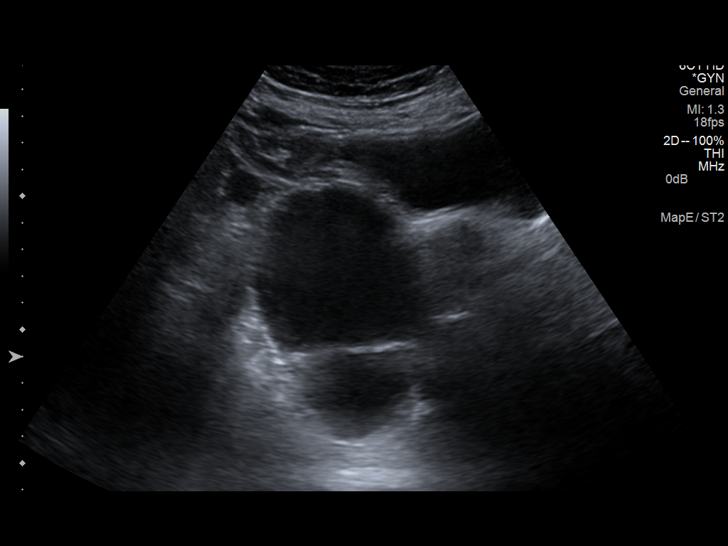
[im 17/98]
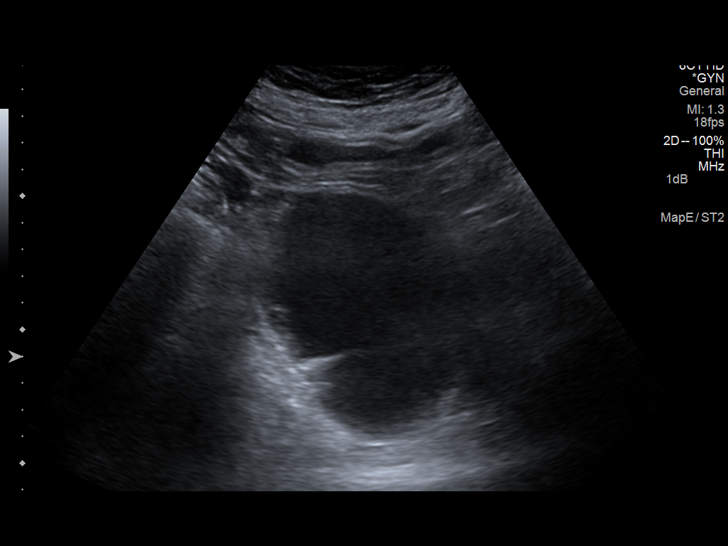
[im 25/98]
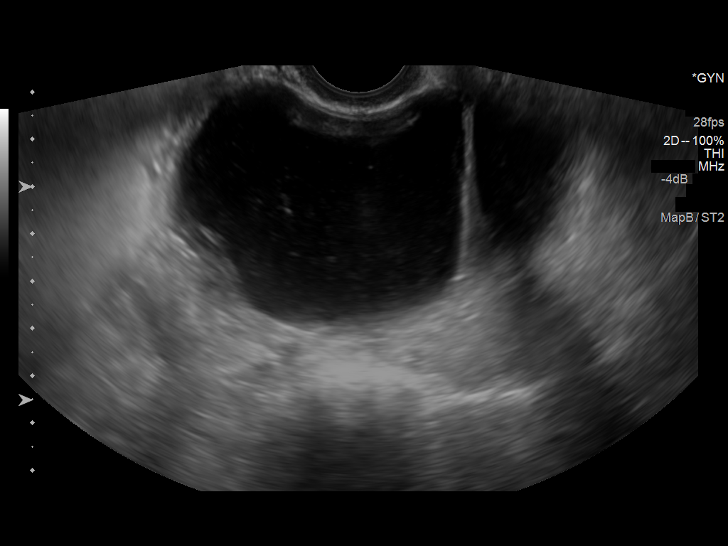
[im 33/98]
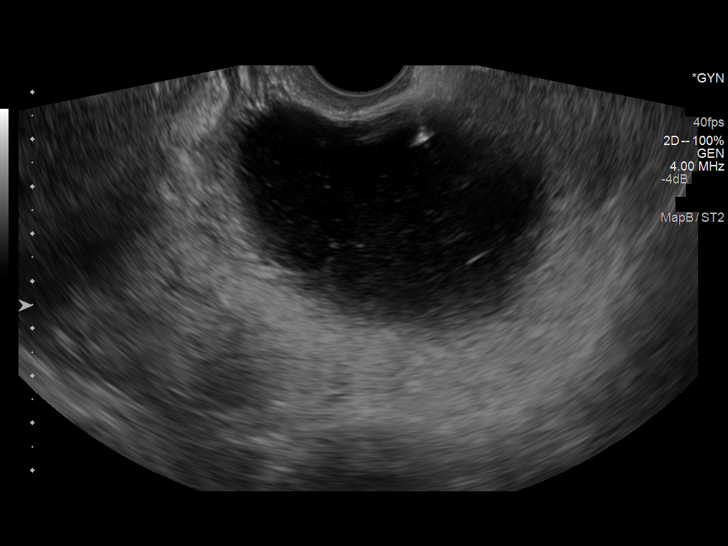
[im 41/98]
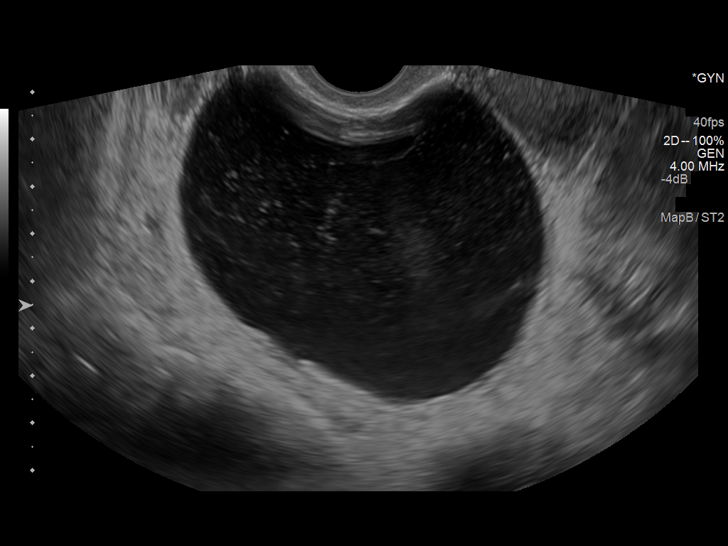
[im 49/98]
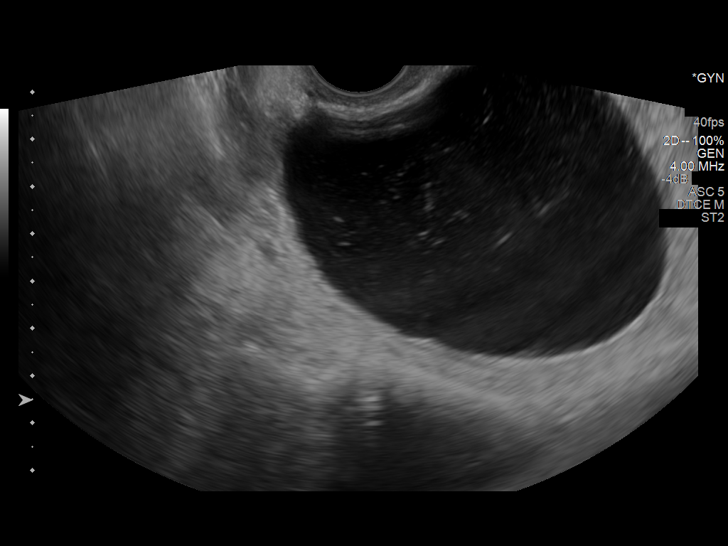
[im 57/98]
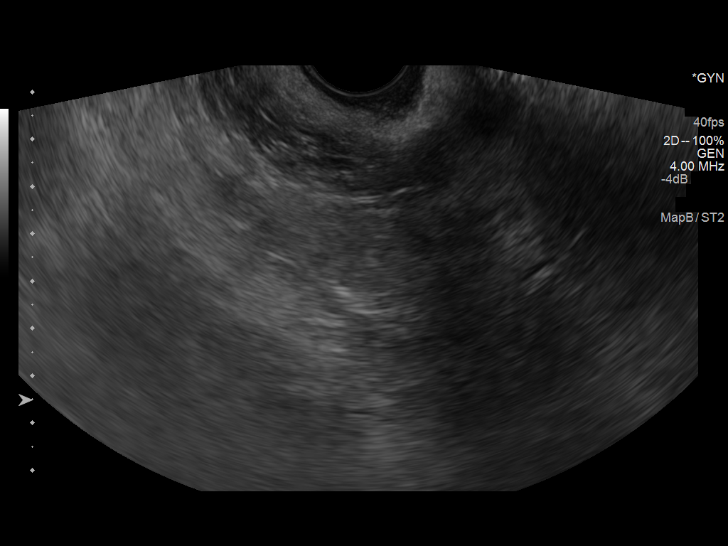
[im 65/98]
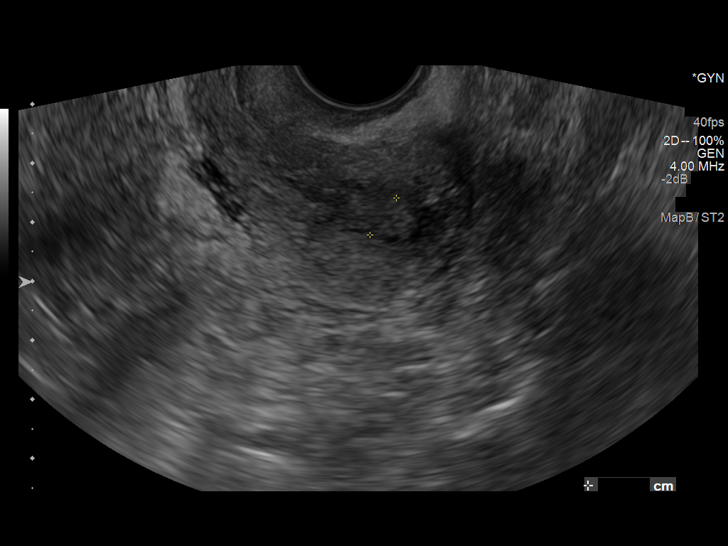
[im 73/98]
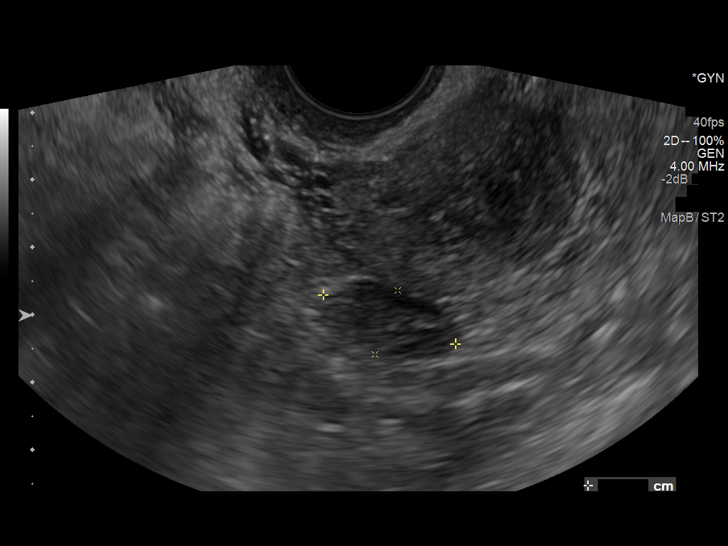
[im 81/98]
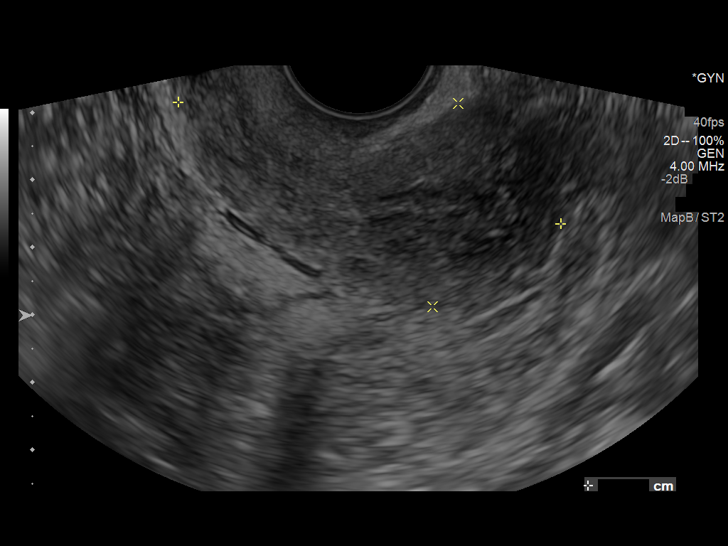
[im 89/98]
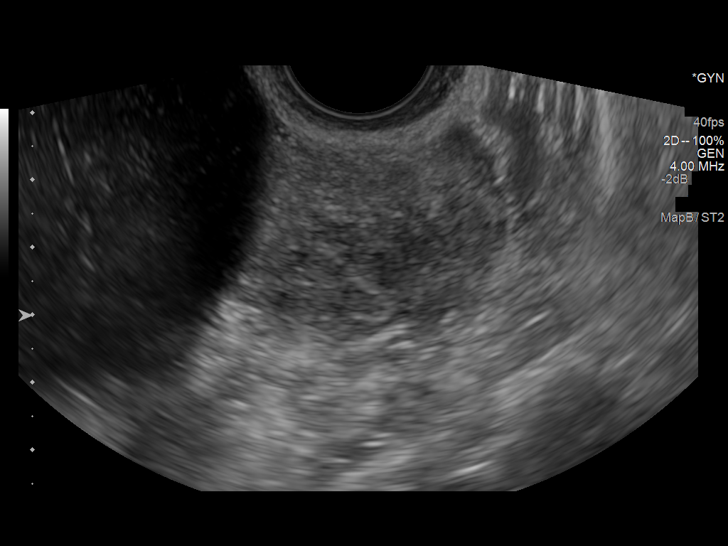
[im 98/98]
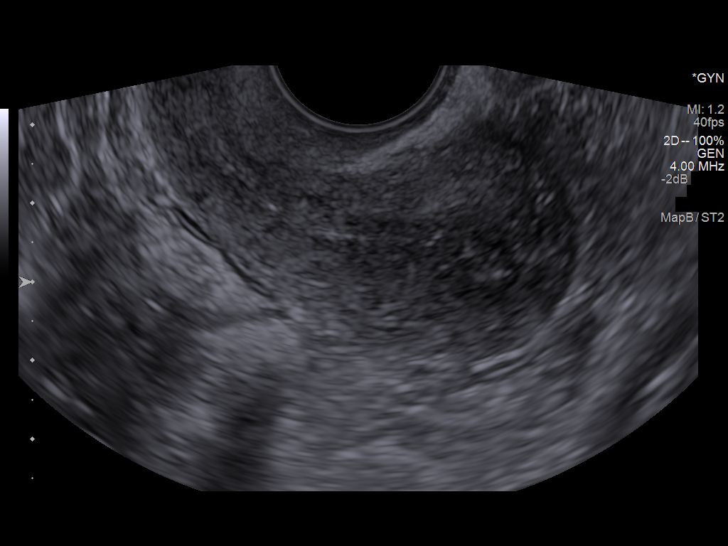

[13 of 25 positions shown; findings below may reference images not displayed]

FINDINGS: Uterus

Measurements: 3 x 4 x 6 cm. No fibroids or other mass visualized.
Nabothian cyst is present.

Endometrium

Thickness: 8 mm.  No focal abnormality visualized.

Right ovary

Measurements: 6.6 x 8.3 x 10.1 cm. Large complex cystic mass
measuring 5.8 x 7.8 x 9.5 cm correlating to the CT finding. This has
a focal region of several thin septations as well as possible small
focal region of mural nodularity. This is concerning for an
epithelial neoplasm which may be benign or malignant.

Left ovary

Measurements: 1 x 1 x 2.1 cm. Normal appearance/no adnexal mass.

Other findings

No free fluid.
IMPRESSION: Large complex cystic right ovarian mass measuring 5.8 x 7.8 x 9.5 cm
with focal area of thin septations and possible small focal region
of mural nodularity. This is suspicious for an epithelial neoplasm
which may be benign or malignant. Recommend GYN consultation and
excision.

## 2017-10-01 ENCOUNTER — Telehealth: Payer: Self-pay

## 2017-10-01 NOTE — Telephone Encounter (Signed)
Talked with Patient to inform her that we have received a fax of her lab results. Pt is due for 6 month routine check but states that she will continue care at the old office in [place]. She asked that I shred the results.
